# Patient Record
Sex: Female | Born: 1980 | Race: Black or African American | Hispanic: No | Marital: Single | Smoking: Former smoker
Health system: Southern US, Community
[De-identification: ages and names within clinical notes are randomized; demographics above are authoritative.]

## PROBLEM LIST (undated history)

## (undated) DIAGNOSIS — Z789 Other specified health status: Secondary | ICD-10-CM

## (undated) HISTORY — DX: Other specified health status: Z78.9

## (undated) HISTORY — PX: NO PAST SURGERIES: SHX2092

---

## 2013-06-15 ENCOUNTER — Emergency Department (HOSPITAL_COMMUNITY)
Admission: EM | Admit: 2013-06-15 | Discharge: 2013-06-15 | Disposition: A | Discharge status: Home / Self Care | Payer: Self-pay | Attending: Emergency Medicine | Admitting: Emergency Medicine

## 2013-06-15 DIAGNOSIS — J029 Acute pharyngitis, unspecified: Secondary | ICD-10-CM | POA: Insufficient documentation

## 2013-06-15 DIAGNOSIS — J3489 Other specified disorders of nose and nasal sinuses: Secondary | ICD-10-CM | POA: Insufficient documentation

## 2013-06-15 DIAGNOSIS — R071 Chest pain on breathing: Secondary | ICD-10-CM | POA: Insufficient documentation

## 2013-06-15 DIAGNOSIS — R1310 Dysphagia, unspecified: Secondary | ICD-10-CM | POA: Insufficient documentation

## 2013-06-15 DIAGNOSIS — J309 Allergic rhinitis, unspecified: Secondary | ICD-10-CM | POA: Insufficient documentation

## 2013-06-15 DIAGNOSIS — R112 Nausea with vomiting, unspecified: Secondary | ICD-10-CM | POA: Insufficient documentation

## 2013-06-15 LAB — RAPID STREP SCREEN (MED CTR MEBANE ONLY): Streptococcus, Group A Screen (Direct): NEGATIVE

## 2013-06-15 NOTE — ED Provider Notes (Signed)
CSN: [id number]     Arrival date & time 06/15/13  1206 History     First MD Initiated Contact with Patient 06/15/13 1221     Chief Complaint  Patient presents with  . Sore Throat   (Consider location/radiation/quality/duration/timing/severity/associated sxs/prior Treatment) HPI Comments: Patient is a 32 year old female presents today with sore throat since Saturday. She states it was associated with nausea and vomiting on Saturday, but The nausea and vomiting resolved later Saturday night. She has mild associated congestion and rhinorrhea. She reports a mild pleuritic chest pain. No long trips, recent surgeries. She is not on any supplemental estrogen. No past history of PE or DVT. She denies cough, fever, chills, ear pain. She is taking cough drops with no relief in her symptoms.  The history is provided by the patient. No language interpreter was used.    No past medical history on file. No past surgical history on file. No family history on file. History  Substance Use Topics  . Smoking status: Not on file  . Smokeless tobacco: Not on file  . Alcohol Use: Not on file   OB History   No data available     Review of Systems  Constitutional: Negative for fever and chills.  HENT: Positive for congestion, sore throat and rhinorrhea.   Gastrointestinal: Positive for nausea (resolved) and vomiting (resolved). Negative for abdominal pain.  All other systems reviewed and are negative.    Allergies  Review of patient's allergies indicates no known allergies.  Home Medications  No current outpatient prescriptions on file. BP 126/90  Pulse 77  Temp(Src) 98.3 F (36.8 C) (Oral)  Resp 18  SpO2 99% Physical Exam  Nursing note and vitals reviewed. Constitutional: She is oriented to person, place, and time. She appears well-developed and well-nourished. No distress.  HENT:  Head: Normocephalic and atraumatic. No trismus in the jaw.  Right Ear: Tympanic membrane, external ear and  ear canal normal.  Left Ear: Tympanic membrane, external ear and ear canal normal.  Nose: Nose normal.  Mouth/Throat: Uvula is midline, oropharynx is clear and moist and mucous membranes are normal. No oral lesions.  Eyes: Conjunctivae are normal.  Neck: Normal range of motion.  Cardiovascular: Normal rate, regular rhythm and normal heart sounds.   Pulmonary/Chest: Effort normal and breath sounds normal. No stridor. No respiratory distress. She has no wheezes. She has no rales.  Abdominal: Soft. She exhibits no distension.  Musculoskeletal: Normal range of motion.  Lymphadenopathy:    She has no cervical adenopathy.  Neurological: She is alert and oriented to person, place, and time. She has normal strength.  Skin: Skin is warm and dry. She is not diaphoretic. No erythema.  Psychiatric: She has a normal mood and affect. Her behavior is normal.    ED Course   Procedures (including critical care time)  Labs Reviewed  RAPID STREP SCREEN  CULTURE, GROUP A STREP   No results found. 1. Viral pharyngitis     MDM  Pt afebrile without tonsillar exudate, negative strep. Presents with no cervical lymphadenopathy, & mild dysphagia; diagnosis of viral pharyngitis. No abx indicated. DC w symptomatic tx for pain  Pt does not appear dehydrated, but did discuss importance of water rehydration. Presentation non concerning for PTA or infxn spread to soft tissue. No trismus or uvula deviation. Pleuritic chest pain. PERC negative. Specific return precautions discussed. Pt able to drink water in ED without difficulty with intact air way. Recommended PCP follow up.   Mora Bellman,  PA-C 06/15/13 1609

## 2013-06-15 NOTE — ED Notes (Signed)
Pt reports sore throat since Saturday morning.  Denies fever at home, denies sinus drainage. States her chest hurts and her body aches. No signs of distress at this time.  Denies cough.   Pt alert oriented X4

## 2013-06-17 LAB — CULTURE, GROUP A STREP

## 2013-06-18 NOTE — ED Provider Notes (Signed)
Medical screening examination/treatment/procedure(s) were performed by non-physician practitioner and as supervising physician I was immediately available for consultation/collaboration.  Candyce Churn, MD 06/18/13 [phone]

## 2015-11-18 ENCOUNTER — Emergency Department (HOSPITAL_COMMUNITY)
Admission: EM | Admit: 2015-11-18 | Discharge: 2015-11-18 | Disposition: A | Discharge status: Home / Self Care | Payer: Self-pay | Attending: Physician Assistant | Admitting: Physician Assistant

## 2015-11-18 ENCOUNTER — Encounter (HOSPITAL_COMMUNITY): Payer: Self-pay | Admitting: Emergency Medicine

## 2015-11-18 DIAGNOSIS — N6489 Other specified disorders of breast: Secondary | ICD-10-CM | POA: Insufficient documentation

## 2015-11-18 DIAGNOSIS — Z87891 Personal history of nicotine dependence: Secondary | ICD-10-CM | POA: Insufficient documentation

## 2015-11-18 DIAGNOSIS — N649 Disorder of breast, unspecified: Secondary | ICD-10-CM

## 2015-11-18 MED ORDER — CEPHALEXIN 500 MG PO CAPS: 500.0000 mg | ORAL_CAPSULE | Freq: Four times a day (QID) | ORAL | Status: DC

## 2015-11-18 NOTE — Discharge Instructions (Signed)

## 2015-11-18 NOTE — ED Notes (Signed)
"  bump" under right breast x 1 week. Appears red. No drainage.

## 2015-11-18 NOTE — ED Provider Notes (Signed)
CSN: [id number]     Arrival date & time 11/18/15  1322 History  By signing my name below, I, Essence Howell, attest that this documentation has been prepared under the direction and in the presence of Montine Circle, PA-C Electronically Signed: Ladene Artist, ED Scribe 11/18/2015 at 2:26 PM.   Chief Complaint  Patient presents with  . Breast Pain   The history is provided by the patient. No language interpreter was used.   HPI Comments: Krystal Gonzalez is a 35 y.o. female who presents to the Emergency Department with a chief complaint of a gradually worsening, tender abscess under the right breast first noticed 1 week ago. Pt states that the area started as a small bump 1 week ago but has grown in size. She reports constant, mild pain to the area that is exacerbated with palpation. No treatments tried PTA. She denies drainage, fever, chills, nausea, vomiting. No h/o previous abscess. Pt does not currently have a gynecologist.   History reviewed. No pertinent past medical history. History reviewed. No pertinent past surgical history. No family history on file. Social History  Substance Use Topics  . Smoking status: Former Research scientist (life sciences)  . Smokeless tobacco: None  . Alcohol Use: Yes   OB History    No data available     Review of Systems  Constitutional: Negative for fever and chills.  Gastrointestinal: Negative for nausea and vomiting.  Skin:       + Abscess   Allergies  Review of patient's allergies indicates no known allergies.  Home Medications   Prior to Admission medications   Not on File   BP 117/72 mmHg  Pulse 66  Temp(Src) 98.3 F (36.8 C) (Oral)  Resp 20  SpO2 100%  LMP 11/17/2015 Physical Exam  Constitutional: She is oriented to person, place, and time. She appears well-developed and well-nourished. No distress.  HENT:  Head: Normocephalic and atraumatic.  Eyes: Conjunctivae and EOM are normal.  Neck: Neck supple. No tracheal deviation present.  Cardiovascular:  Normal rate.   Pulmonary/Chest: Effort normal. No respiratory distress.  Musculoskeletal: Normal range of motion.  Neurological: She is alert and oriented to person, place, and time.  Skin: Skin is warm and dry.  0.5 cm erythematous lesion under R breast. Possible early abscess or cellulitis. No discharge or drainage.  Chaperone present  Psychiatric: She has a normal mood and affect. Her behavior is normal.  Nursing note and vitals reviewed.  ED Course  Procedures (including critical care time) DIAGNOSTIC STUDIES: Oxygen Saturation is 100% on RA, normal by my interpretation.    COORDINATION OF CARE: 2:17 PM-Discussed treatment plan which includes Keflex with pt at bedside and pt agreed to plan.     MDM   Final diagnoses:  Breast lesion    Patient with small cellulitic breast lesion under right breast. Lesion has been there for approximately 1 week. Will treat with Keflex. Recommend close follow-up with OB/GYN in the next 3 days for recheck. Patient may need further imaging, though currently I suspect this cellulitis without abscess.  I personally performed the services described in this documentation, which was scribed in my presence. The recorded information has been reviewed and is accurate.      Montine Circle, PA-C 11/18/15 [place], MD 11/19/15 [phone]

## 2017-06-10 ENCOUNTER — Encounter (HOSPITAL_COMMUNITY): Payer: Self-pay

## 2017-06-10 ENCOUNTER — Emergency Department (HOSPITAL_COMMUNITY)
Admission: EM | Admit: 2017-06-10 | Discharge: 2017-06-10 | Disposition: A | Discharge status: Home / Self Care | Payer: Self-pay | Attending: Emergency Medicine | Admitting: Emergency Medicine

## 2017-06-10 DIAGNOSIS — Z87891 Personal history of nicotine dependence: Secondary | ICD-10-CM | POA: Insufficient documentation

## 2017-06-10 DIAGNOSIS — Z79899 Other long term (current) drug therapy: Secondary | ICD-10-CM | POA: Insufficient documentation

## 2017-06-10 DIAGNOSIS — J020 Streptococcal pharyngitis: Secondary | ICD-10-CM | POA: Insufficient documentation

## 2017-06-10 LAB — POC URINE PREG, ED: PREG TEST UR: NEGATIVE

## 2017-06-10 LAB — RAPID STREP SCREEN (MED CTR MEBANE ONLY): STREPTOCOCCUS, GROUP A SCREEN (DIRECT): POSITIVE — AB

## 2017-06-10 MED ORDER — CLINDAMYCIN HCL 150 MG PO CAPS
300.0000 mg | ORAL_CAPSULE | Freq: Once | ORAL | Status: AC
Start: 2017-06-10 — End: 2017-06-10
  Administered 2017-06-10: 300 mg via ORAL
  Filled 2017-06-10: qty 2

## 2017-06-10 MED ORDER — LIDOCAINE VISCOUS 2 % MT SOLN: 20.0000 mL | OROMUCOSAL | 0 refills | Status: DC | PRN

## 2017-06-10 MED ORDER — DEXAMETHASONE 10 MG/ML FOR PEDIATRIC ORAL USE: 10.0000 mg | Freq: Once | INTRAMUSCULAR | Status: DC

## 2017-06-10 MED ORDER — CLINDAMYCIN HCL 150 MG PO CAPS: 150.0000 mg | ORAL_CAPSULE | Freq: Three times a day (TID) | ORAL | 0 refills | Status: DC

## 2017-06-10 MED ORDER — KETOROLAC TROMETHAMINE 30 MG/ML IJ SOLN
30.0000 mg | Freq: Once | INTRAMUSCULAR | Status: AC
Start: 2017-06-10 — End: 2017-06-10
  Administered 2017-06-10: 30 mg via INTRAMUSCULAR
  Filled 2017-06-10: qty 1

## 2017-06-10 MED ORDER — DEXAMETHASONE 4 MG PO TABS
10.0000 mg | ORAL_TABLET | Freq: Once | ORAL | Status: AC
  Administered 2017-06-10: 10 mg via ORAL
  Filled 2017-06-10: qty 3

## 2017-06-10 NOTE — ED Provider Notes (Signed)
[facility] DEPT Provider Note   CSN: [id number] Arrival date & time: 06/10/17  1247  By signing my name below, I, Ephriam Jenkins, attest that this documentation has been prepared under the direction and in the presence of [employer].  Electronically Signed: Ephriam Jenkins, ED Scribe. 06/10/17. 2:35 PM.  History   Chief Complaint Chief Complaint  Patient presents with  . Sore Throat   HPI HPI Comments: Krystal Gonzalez is a 36 y.o. female who presents to the Emergency Department complaining of sore throat with dysphagia that started yesterday. Pt further reports subjective fever, her temperature was 99.4 here today. She reports worst pain to the right side of her throat. Pt took Tylenol yesterday with no significant relief. She has not taken any medications today. No known sick contacts. No difficulty swallowing. No change in voice.   The history is provided by the patient. No language interpreter was used.    History reviewed. No pertinent past medical history.  There are no active problems to display for this patient.   History reviewed. No pertinent surgical history.  OB History    No data available       Home Medications    Prior to Admission medications   Medication Sig Start Date End Date Taking? Authorizing Provider  cephALEXin (KEFLEX) 500 MG capsule Take 1 capsule (500 mg total) by mouth 4 (four) times daily. 11/18/15   Montine Circle, PA-C    Family History History reviewed. No pertinent family history.  Social History Social History  Substance Use Topics  . Smoking status: Former Research scientist (life sciences)  . Smokeless tobacco: Never Used  . Alcohol use Yes     Allergies   Patient has no known allergies.   Review of Systems Review of Systems  Constitutional: Negative for chills and fever.  HENT: Positive for sore throat. Negative for dental problem, drooling, trouble swallowing and voice change.   Respiratory: Negative for shortness of breath.       Physical Exam Updated Vital Signs BP 118/75 (BP Location: Left Arm)   Pulse 100   Temp 99.4 F (37.4 C) (Oral)   Resp 18   Ht 5\' 3"  (1.6 m)   Wt 200 lb (90.7 kg)   LMP 05/27/2017 (Within Days)   SpO2 96%   BMI 35.43 kg/m   Physical Exam  Constitutional: She is oriented to person, place, and time. She appears well-developed and well-nourished. No distress.  HENT:  Head: Normocephalic and atraumatic.  Mouth/Throat: Uvula is midline and mucous membranes are normal. No trismus in the jaw. No uvula swelling. Oropharyngeal exudate, posterior oropharyngeal edema and posterior oropharyngeal erythema present. No tonsillar abscesses. Tonsils are 3+ on the right. Tonsils are 3+ on the left. Tonsillar exudate.  Speaking complete sentences without muffled voice.  Neck: Normal range of motion. Neck supple.  Pulmonary/Chest: Effort normal.  Managing secretions maintaining airway  Lymphadenopathy:    She has cervical adenopathy (bilaterall).  Neurological: She is alert and oriented to person, place, and time.  Skin: Skin is warm and dry. She is not diaphoretic.  Psychiatric: She has a normal mood and affect. Judgment normal.  Nursing note and vitals reviewed.    ED Treatments / Results  DIAGNOSTIC STUDIES: Oxygen Saturation is 96% on RA, normal by my interpretation.  COORDINATION OF CARE: 2:30 PM-Discussed treatment plan with pt at bedside and pt agreed to plan.   Labs (all labs ordered are listed, but only abnormal results are displayed) Labs Reviewed  RAPID STREP SCREEN (NOT  AT [facility]) - Abnormal; Notable for the following:       Result Value   Streptococcus, Group A Screen (Direct) POSITIVE (*)    All other components within normal limits  POC URINE PREG, ED    EKG  EKG Interpretation None       Radiology No results found.  Procedures Procedures (including critical care time)  Medications Ordered in ED Medications  ketorolac (TORADOL) 30 MG/ML injection 30 mg  (30 mg Intramuscular Given 06/10/17 1446)  clindamycin (CLEOCIN) capsule 300 mg (300 mg Oral Given 06/10/17 1446)  dexamethasone (DECADRON) tablet 10 mg (10 mg Oral Given 06/10/17 1446)     Initial Impression / Assessment and Plan / ED Course  I have reviewed the triage vital signs and the nursing notes.  Pertinent labs & imaging results that were available during my care of the patient were reviewed by me and considered in my medical decision making (see chart for details).     Pt afebrile with tonsillar exudate, cervical lymphadenopathy, & dysphagia; diagnosis of strep. Treated in the Ed with steroids, NSAIDs, Pain medication. Patient with penicillin allergy given clindamycin. Pt appears mildly dehydrated, discussed importance of water rehydration. Presentation non concerning for PTA or infxn spread to soft tissue. No trismus or uvula deviation. Specific return precautions discussed. Pt able to drink water in ED without difficulty with intact air way. Recommended PCP follow up.   Final Clinical Impressions(s) / ED Diagnoses   Final diagnoses:  Strep pharyngitis    New Prescriptions Discharge Medication List as of 06/10/2017  4:20 PM    START taking these medications   Details  clindamycin (CLEOCIN) 150 MG capsule Take 1 capsule (150 mg total) by mouth 3 (three) times daily., Starting Mon 06/10/2017, Print    lidocaine (XYLOCAINE) 2 % solution Use as directed 20 mLs in the mouth or throat as needed for mouth pain., Starting Mon 06/10/2017, Print      I personally performed the services described in this documentation, which was scribed in my presence. The recorded information has been reviewed and is accurate.     Doristine Devoid, PA-C 06/10/17 [place], DO 06/10/17 1739

## 2017-06-10 NOTE — Discharge Instructions (Signed)
Please of antibiotic as prescribed. Her strep test was positive. May take Motrin and Tylenol for pain and fever. Use the viscous lidocaine to rinsing her mouth and spit out. We have discussed reasons to return to the ED.

## 2017-06-10 NOTE — ED Triage Notes (Signed)
Pt states sore throat that began last night. She reports chills as well. States it is painful to swallow. Difficult to see but noted to have white spots on right tonsil. Pt afebrile in triage. Airway intact.

## 2019-09-27 ENCOUNTER — Emergency Department (HOSPITAL_COMMUNITY): Payer: No Typology Code available for payment source

## 2019-09-27 ENCOUNTER — Emergency Department (HOSPITAL_COMMUNITY)
Admission: EM | Admit: 2019-09-27 | Discharge: 2019-09-27 | Disposition: A | Discharge status: Home / Self Care | Payer: No Typology Code available for payment source | Attending: Emergency Medicine | Admitting: Emergency Medicine

## 2019-09-27 ENCOUNTER — Encounter (HOSPITAL_COMMUNITY): Payer: Self-pay | Admitting: Emergency Medicine

## 2019-09-27 ENCOUNTER — Other Ambulatory Visit: Payer: Self-pay

## 2019-09-27 DIAGNOSIS — M25512 Pain in left shoulder: Secondary | ICD-10-CM | POA: Diagnosis not present

## 2019-09-27 DIAGNOSIS — M7918 Myalgia, other site: Secondary | ICD-10-CM

## 2019-09-27 DIAGNOSIS — Z87891 Personal history of nicotine dependence: Secondary | ICD-10-CM | POA: Insufficient documentation

## 2019-09-27 DIAGNOSIS — M545 Low back pain: Secondary | ICD-10-CM | POA: Insufficient documentation

## 2019-09-27 MED ORDER — LIDOCAINE 5 % EX PTCH: 1.0000 | MEDICATED_PATCH | CUTANEOUS | 0 refills | Status: DC

## 2019-09-27 MED ORDER — NAPROXEN 375 MG PO TABS: 375.0000 mg | ORAL_TABLET | Freq: Two times a day (BID) | ORAL | 0 refills | Status: DC

## 2019-09-27 MED ORDER — METHOCARBAMOL 500 MG PO TABS: 500.0000 mg | ORAL_TABLET | Freq: Two times a day (BID) | ORAL | 0 refills | Status: DC

## 2019-09-27 MED ORDER — ACETAMINOPHEN 325 MG PO TABS
650.0000 mg | ORAL_TABLET | Freq: Once | ORAL | Status: AC
  Administered 2019-09-27: 650 mg via ORAL
  Filled 2019-09-27: qty 2
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
## 2019-09-27 NOTE — ED Notes (Signed)
Pt signed preg waiver in xray, discontinuing order for POC preg test
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
## 2019-09-27 NOTE — ED Notes (Signed)
Patient verbalizes understanding of discharge instructions. Opportunity for questioning and answers were provided. Armband removed by staff, pt discharged from ED.  

## 2021-08-19 ENCOUNTER — Encounter (HOSPITAL_BASED_OUTPATIENT_CLINIC_OR_DEPARTMENT_OTHER): Payer: Self-pay | Admitting: Emergency Medicine

## 2021-08-19 ENCOUNTER — Other Ambulatory Visit: Payer: Self-pay

## 2021-08-19 ENCOUNTER — Emergency Department (HOSPITAL_BASED_OUTPATIENT_CLINIC_OR_DEPARTMENT_OTHER)
Admission: EM | Admit: 2021-08-19 | Discharge: 2021-08-19 | Disposition: A | Discharge status: Home / Self Care | Payer: Medicaid Other | Attending: Emergency Medicine | Admitting: Emergency Medicine

## 2021-08-19 DIAGNOSIS — Z87891 Personal history of nicotine dependence: Secondary | ICD-10-CM | POA: Diagnosis not present

## 2021-08-19 DIAGNOSIS — Z3201 Encounter for pregnancy test, result positive: Secondary | ICD-10-CM | POA: Diagnosis not present

## 2021-08-19 DIAGNOSIS — O219 Vomiting of pregnancy, unspecified: Secondary | ICD-10-CM | POA: Insufficient documentation

## 2021-08-19 DIAGNOSIS — Z3A00 Weeks of gestation of pregnancy not specified: Secondary | ICD-10-CM | POA: Diagnosis not present

## 2021-08-19 LAB — PREGNANCY, URINE: Preg Test, Ur: POSITIVE — AB

## 2021-08-19 NOTE — ED Triage Notes (Signed)
Nausea , 2 weeks late , requesting preg test. No further symptoms

## 2021-08-19 NOTE — ED Notes (Signed)
Pt discharged to home. Discharge instructions have been discussed with patient and/or family members. Pt verbally acknowledges understanding d/c instructions, and endorses comprehension to checkout at registration before leaving.  °

## 2021-08-19 NOTE — ED Provider Notes (Signed)
[facility] EMERGENCY DEPT Provider Note   CSN: [id number] Arrival date & time: 08/19/21  1235     History Chief Complaint  Patient presents with   Nausea    Krystal Gonzalez is a 40 y.o. female presents to the emergency department with nausea and requesting pregnancy test.  Patient states that her menstrual period is approximately 2 weeks late.  She did take a drugstore pregnancy test that was positive.  She presents today with persistent nausea and wanting to verify her pregnancy status.  She is having no other symptoms at this time.  No fevers, chills, chest pain, shortness of breath, abdominal pain, pelvic pain, vaginal bleeding or discharge, dysuria or hematuria.  HPI     No past medical history on file.  There are no problems to display for this patient.   History reviewed. No pertinent surgical history.   OB History   No obstetric history on file.     No family history on file.  Social History   Tobacco Use   Smoking status: Former   Smokeless tobacco: Never  Substance Use Topics   Alcohol use: Yes   Drug use: No    Home Medications Prior to Admission medications   Medication Sig Start Date End Date Taking? Authorizing Provider  cephALEXin (KEFLEX) 500 MG capsule Take 1 capsule (500 mg total) by mouth 4 (four) times daily. 11/18/15   Montine Circle, PA-C  clindamycin (CLEOCIN) 150 MG capsule Take 1 capsule (150 mg total) by mouth 3 (three) times daily. 06/10/17   Ocie Cornfield T, PA-C  lidocaine (LIDODERM) 5 % Place 1 patch onto the skin daily. Remove & Discard patch within 12 hours or as directed by MD 09/27/19   Nuala Alpha A, PA-C  lidocaine (XYLOCAINE) 2 % solution Use as directed 20 mLs in the mouth or throat as needed for mouth pain. 06/10/17   Doristine Devoid, PA-C  methocarbamol (ROBAXIN) 500 MG tablet Take 1 tablet (500 mg total) by mouth 2 (two) times daily. 09/27/19   Nuala Alpha A, PA-C  naproxen (NAPROSYN) 375 MG  tablet Take 1 tablet (375 mg total) by mouth 2 (two) times daily. 09/27/19   Deliah Boston, PA-C    Allergies    Patient has no known allergies.  Review of Systems   Review of Systems  Constitutional:  Negative for chills and fever.  Respiratory:  Negative for shortness of breath.   Cardiovascular:  Negative for chest pain.  Gastrointestinal:  Positive for nausea and vomiting. Negative for abdominal pain, constipation and diarrhea.  Genitourinary:  Positive for menstrual problem. Negative for dysuria, frequency, hematuria, pelvic pain, urgency, vaginal bleeding and vaginal discharge.  All other systems reviewed and are negative.  Physical Exam Updated Vital Signs Ht 5\' 4"  (1.626 m)   Wt 90.7 kg   LMP 07/05/2021 (Approximate)   BMI 34.33 kg/m   Physical Exam Vitals and nursing note reviewed.  Constitutional:      Appearance: Normal appearance.  HENT:     Head: Normocephalic and atraumatic.  Eyes:     Conjunctiva/sclera: Conjunctivae normal.  Pulmonary:     Effort: Pulmonary effort is normal. No respiratory distress.  Abdominal:     General: There is no distension.     Palpations: Abdomen is soft.     Tenderness: There is no abdominal tenderness. There is no guarding.  Skin:    General: Skin is warm and dry.  Neurological:     Mental Status: She is alert.  Psychiatric:        Mood and Affect: Mood normal.        Behavior: Behavior normal.    ED Results / Procedures / Treatments   Labs (all labs ordered are listed, but only abnormal results are displayed) Labs Reviewed  PREGNANCY, URINE - Abnormal; Notable for the following components:      Result Value   Preg Test, Ur POSITIVE (*)    All other components within normal limits    EKG None  Radiology No results found.  Procedures Procedures   Medications Ordered in ED Medications - No data to display  ED Course  I have reviewed the triage vital signs and the nursing notes.  Pertinent labs &  imaging results that were available during my care of the patient were reviewed by me and considered in my medical decision making (see chart for details).    MDM Rules/Calculators/A&P                           Patient is 40 year old female who presents with nausea and requesting pregnancy test.  She states that her menstrual period is approximately 2 weeks late, and she took a drugstore pregnancy test that was negative.  She presents today wanting to confirm this result.  Other than her nausea she has had no other symptoms.  No fevers, chills, abdominal pain, pelvic pain, vaginal bleeding or discharge, dysuria, hematuria, increased urinary frequency or urgency.  Urine pregnancy test positive.  Discussed results with patient.  She does not have an established OB/GYN so I will give her the contact information for this.  She is otherwise not requiring further evaluation, admission or inpatient treatment for symptoms at this time.  Plan to discharge home with strict return precautions.  Patient agreeable to plan.  Final Clinical Impression(s) / ED Diagnoses Final diagnoses:  Nausea and vomiting during pregnancy  Positive pregnancy test    Rx / DC Orders ED Discharge Orders     None        Kateri Plummer, PA-C 08/19/21 1446    Gareth Morgan, MD 08/20/21 2313

## 2021-08-19 NOTE — Discharge Instructions (Addendum)
You were seen in the emergency department today for nausea and requesting a pregnancy test.  Your urine pregnancy test was positive today.  As we discussed you need to follow-up with an OB/GYN for blood work and ultrasound for dating.  I have attached their contact information for you to make an appointment.  I recommend you start taking prenatal vitamins. Refrain from taking aspirin or ibuprofen, drinking alcohol or smoking cigarettes.   Continue to monitor how you're doing and return to the ER for new or worsening symptoms such as abdominal pain, vaginal bleeding or pelvic pain.   It has been a pleasure seeing and caring for you today and I wish you luck with your pregnancy!

## 2021-10-12 ENCOUNTER — Other Ambulatory Visit: Payer: Self-pay

## 2021-10-12 ENCOUNTER — Other Ambulatory Visit: Payer: Self-pay | Admitting: Obstetrics & Gynecology

## 2021-10-12 ENCOUNTER — Other Ambulatory Visit: Payer: Self-pay | Admitting: Family

## 2021-10-12 DIAGNOSIS — N6310 Unspecified lump in the right breast, unspecified quadrant: Secondary | ICD-10-CM

## 2021-10-12 LAB — OB RESULTS CONSOLE RUBELLA ANTIBODY, IGM: Rubella: IMMUNE

## 2021-10-12 LAB — OB RESULTS CONSOLE ANTIBODY SCREEN: Antibody Screen: NEGATIVE

## 2021-10-12 LAB — HEPATITIS C ANTIBODY: HCV Ab: NEGATIVE

## 2021-10-12 LAB — OB RESULTS CONSOLE ABO/RH: RH Type: POSITIVE

## 2021-10-12 LAB — OB RESULTS CONSOLE HEPATITIS B SURFACE ANTIGEN: Hepatitis B Surface Ag: NEGATIVE

## 2021-10-23 ENCOUNTER — Other Ambulatory Visit: Payer: Self-pay

## 2021-10-28 ENCOUNTER — Encounter (HOSPITAL_BASED_OUTPATIENT_CLINIC_OR_DEPARTMENT_OTHER): Payer: Self-pay | Admitting: Emergency Medicine

## 2021-10-28 ENCOUNTER — Other Ambulatory Visit: Payer: Self-pay

## 2021-10-28 ENCOUNTER — Emergency Department (HOSPITAL_BASED_OUTPATIENT_CLINIC_OR_DEPARTMENT_OTHER)
Admission: EM | Admit: 2021-10-28 | Discharge: 2021-10-28 | Disposition: A | Discharge status: Home / Self Care | Payer: Medicaid Other | Attending: Emergency Medicine | Admitting: Emergency Medicine

## 2021-10-28 ENCOUNTER — Emergency Department (HOSPITAL_BASED_OUTPATIENT_CLINIC_OR_DEPARTMENT_OTHER): Payer: Medicaid Other

## 2021-10-28 DIAGNOSIS — Z87891 Personal history of nicotine dependence: Secondary | ICD-10-CM | POA: Diagnosis not present

## 2021-10-28 DIAGNOSIS — O26892 Other specified pregnancy related conditions, second trimester: Secondary | ICD-10-CM | POA: Insufficient documentation

## 2021-10-28 DIAGNOSIS — Z79899 Other long term (current) drug therapy: Secondary | ICD-10-CM | POA: Insufficient documentation

## 2021-10-28 DIAGNOSIS — Z3A17 17 weeks gestation of pregnancy: Secondary | ICD-10-CM | POA: Insufficient documentation

## 2021-10-28 LAB — CBC WITH DIFFERENTIAL/PLATELET
Abs Immature Granulocytes: 0.04 10*3/uL (ref 0.00–0.07)
Basophils Absolute: 0 10*3/uL (ref 0.0–0.1)
Basophils Relative: 0 %
Eosinophils Absolute: 0.6 10*3/uL — ABNORMAL HIGH (ref 0.0–0.5)
Eosinophils Relative: 5 %
HCT: 35.3 % — ABNORMAL LOW (ref 36.0–46.0)
Hemoglobin: 11.8 g/dL — ABNORMAL LOW (ref 12.0–15.0)
Immature Granulocytes: 0 %
Lymphocytes Relative: 29 %
Lymphs Abs: 3.3 10*3/uL (ref 0.7–4.0)
MCH: 30.9 pg (ref 26.0–34.0)
MCHC: 33.4 g/dL (ref 30.0–36.0)
MCV: 92.4 fL (ref 80.0–100.0)
Monocytes Absolute: 0.9 10*3/uL (ref 0.1–1.0)
Monocytes Relative: 8 %
Neutro Abs: 6.6 10*3/uL (ref 1.7–7.7)
Neutrophils Relative %: 58 %
Platelets: 281 10*3/uL (ref 150–400)
RBC: 3.82 MIL/uL — ABNORMAL LOW (ref 3.87–5.11)
RDW: 13 % (ref 11.5–15.5)
WBC: 11.5 10*3/uL — ABNORMAL HIGH (ref 4.0–10.5)
nRBC: 0 % (ref 0.0–0.2)

## 2021-10-28 LAB — COMPREHENSIVE METABOLIC PANEL
ALT: 24 U/L (ref 0–44)
AST: 19 U/L (ref 15–41)
Albumin: 3.7 g/dL (ref 3.5–5.0)
Alkaline Phosphatase: 36 U/L — ABNORMAL LOW (ref 38–126)
Anion gap: 8 (ref 5–15)
BUN: 12 mg/dL (ref 6–20)
CO2: 25 mmol/L (ref 22–32)
Calcium: 9.5 mg/dL (ref 8.9–10.3)
Chloride: 101 mmol/L (ref 98–111)
Creatinine, Ser: 0.54 mg/dL (ref 0.44–1.00)
GFR, Estimated: >60 mL/min (ref >60)
Glucose, Bld: 76 mg/dL (ref 70–99)
Potassium: 3.6 mmol/L (ref 3.5–5.1)
Sodium: 134 mmol/L — ABNORMAL LOW (ref 135–145)
Total Bilirubin: 0.3 mg/dL (ref 0.3–1.2)
Total Protein: 6.8 g/dL (ref 6.5–8.1)

## 2021-10-28 LAB — HCG, QUANTITATIVE, PREGNANCY: hCG, Beta Chain, Quant, S: 28436 m[IU]/mL — ABNORMAL HIGH (ref <5)

## 2021-10-28 NOTE — ED Triage Notes (Signed)
16 wks pregnancy , abdominal cramps , lower back pain, denies vaginal bleeding. Sts feels baby moving 2 days ago .

## 2021-10-28 NOTE — ED Notes (Signed)
Denies any pain or vaginal discharges.

## 2021-10-28 NOTE — ED Provider Notes (Signed)
[name] EMERGENCY DEPT Provider Note   CSN: [id number] Arrival date & time: 10/28/21  1140     History Chief Complaint  Patient presents with   Abdominal Pain    Krystal Gonzalez is a 40 y.o. female who reports being [redacted] weeks pregnant presenting today due to abdominal cramping.  These cramps happened earlier today however are no longer bothering her.  No bleeding or vaginal discharge.  Is following with the health department for her pregnancy.  Has not yet had an ultrasound and would like one today.  Denies any nausea, vomiting, diarrhea, abdominal pain or abdominal trauma.  No history of abdominal surgery.  No fevers or chills.     No past medical history on file.  There are no problems to display for this patient.   No past surgical history on file.   OB History     Gravida  1   Para      Term      Preterm      AB      Living         SAB      IAB      Ectopic      Multiple      Live Births              No family history on file.  Social History   Tobacco Use   Smoking status: Former   Smokeless tobacco: Never  Substance Use Topics   Alcohol use: Yes   Drug use: No    Home Medications Prior to Admission medications   Medication Sig Start Date End Date Taking? Authorizing Provider  cephALEXin (KEFLEX) 500 MG capsule Take 1 capsule (500 mg total) by mouth 4 (four) times daily. 11/18/15   Montine Circle, PA-C  clindamycin (CLEOCIN) 150 MG capsule Take 1 capsule (150 mg total) by mouth 3 (three) times daily. 06/10/17   Ocie Cornfield T, PA-C  lidocaine (LIDODERM) 5 % Place 1 patch onto the skin daily. Remove & Discard patch within 12 hours or as directed by MD 09/27/19   Nuala Alpha A, PA-C  lidocaine (XYLOCAINE) 2 % solution Use as directed 20 mLs in the mouth or throat as needed for mouth pain. 06/10/17   Doristine Devoid, PA-C  methocarbamol (ROBAXIN) 500 MG tablet Take 1 tablet (500 mg total) by mouth 2 (two) times  daily. 09/27/19   Nuala Alpha A, PA-C  naproxen (NAPROSYN) 375 MG tablet Take 1 tablet (375 mg total) by mouth 2 (two) times daily. 09/27/19   Deliah Boston, PA-C    Allergies    Patient has no known allergies.  Review of Systems   Review of Systems  Gastrointestinal:  Negative for abdominal pain, diarrhea, nausea and vomiting.  Genitourinary:  Negative for vaginal bleeding and vaginal discharge.   Physical Exam Updated Vital Signs BP 129/77    Pulse 78    Temp 98.5 F (36.9 C)    Resp 15    Ht 5\' 3"  (1.6 m)    Wt 90.7 kg    LMP 07/05/2021 (Approximate)    SpO2 100%    BMI 35.43 kg/m   Physical Exam Vitals and nursing note reviewed.  Constitutional:      General: She is not in acute distress.    Appearance: Normal appearance. She is not ill-appearing.  HENT:     Head: Normocephalic and atraumatic.  Eyes:     General: No scleral icterus.  Conjunctiva/sclera: Conjunctivae normal.  Pulmonary:     Effort: Pulmonary effort is normal. No respiratory distress.  Abdominal:     General: Abdomen is flat.     Palpations: Abdomen is soft.     Tenderness: There is no abdominal tenderness.     Hernia: No hernia is present.  Skin:    General: Skin is warm and dry.     Findings: No rash.  Neurological:     Mental Status: She is alert.  Psychiatric:        Mood and Affect: Mood normal.    ED Results / Procedures / Treatments   Labs (all labs ordered are listed, but only abnormal results are displayed) Labs Reviewed  HCG, QUANTITATIVE, PREGNANCY  URINALYSIS, ROUTINE W REFLEX MICROSCOPIC  CBC WITH DIFFERENTIAL/PLATELET  COMPREHENSIVE METABOLIC PANEL    EKG None  Radiology US OB Limited > 14 wks  Result Date: 10/28/2021 CLINICAL DATA:  Abdominal/pelvic cramping. No prior OB ultrasound. Patient is 16 weeks and 5 days pregnant based on her last menstrual period. EXAM: LIMITED OBSTETRIC ULTRASOUND COMPARISON:  None. FINDINGS: Number of Fetuses: 1 Heart Rate:  155 bpm  Movement: Yes Presentation: Cephalic Placental Location: Posterior Previa: No Amniotic Fluid (Subjective):  Within normal limits FL: 2.51 cm 17 w  4 d MATERNAL FINDINGS: Cervix:  Appears closed. Uterus/Adnexae: No abnormality visualized. IMPRESSION: 1. Single live intrauterine pregnancy with a measured gestational age, based on a femur length, a 17 weeks and 4 days. 2. No evidence of a pregnancy complication. This exam is performed on an emergent basis and does not comprehensively evaluate fetal size, dating, or anatomy; follow-up complete OB US should be considered if further fetal assessment is warranted. Electronically Signed   By: Lajean Manes M.D.   On: 10/28/2021 14:38    Procedures Procedures   Medications Ordered in ED Medications - No data to display  ED Course  I have reviewed the triage vital signs and the nursing notes.  Pertinent labs & imaging results that were available during my care of the patient were reviewed by me and considered in my medical decision making (see chart for details).    MDM Rules/Calculators/A&P 40 year old female presenting today with complaint of abdominal cramping.  Reports 16 weeks of pregnancy that is being followed by the health department.  Denies bleeding, vaginal discharge or prom.  She has not had an ultrasound and was requesting 1 today.  It showed a single IUP and they estimated the age of 35 weeks and 4 days.  Patient was made aware of this and is requesting a copy of her work-up for the health department.  I told her that she would need to get in contact with medical records however I am able to give her a read of the ultrasound.  She is agreeable to this.  No signs of pregnancy complications at this moment.  Abdominal pain has not been present since she has been in the department.  Discharge at this time. Final Clinical Impression(s) / ED Diagnoses Final diagnoses:  [redacted] weeks gestation of pregnancy    Rx / DC Orders Results and diagnoses  were explained to the patient. Return precautions discussed in full. Patient had no additional questions and expressed complete understanding.     Rhae Hammock, PA-C 10/28/21 1534    Lucrezia Starch, MD 10/30/21 [phone]

## 2021-10-28 NOTE — Discharge Instructions (Addendum)
Your work-up is reassuring today.  Your ultrasound shows a pregnancy that is in the correct place.  The read is as follows: Single live intrauterine pregnancy with a measured gestational  age, based on a femur length, a 17 weeks and 4 days.    Please continue to follow with the health department for your pregnancy and read the attached information.

## 2021-11-12 NOTE — L&D Delivery Note (Signed)
Delivery Note ?At 1:04 PM a viable and healthy female was delivered via Vaginal, Spontaneous (Presentation: Right Occiput Anterior).  APGAR: 9, 9; weight  pending.   ?Placenta status: Spontaneous, Intact.  Cord: 3 vessels with the following complications: None.   ? ?Anesthesia: Local ?Episiotomy: None ?Lacerations: 1st degree;Perineal ?Suture Repair: 3.0 vicryl ?Est. Blood Loss (mL): 100 ? ?Mom to postpartum.  Baby to Couplet care / Skin to Skin. ? ?Gayla Dockham is a 41 y.o. female [id number] with IUP at 24w0dadmitted for IOL for AMA.  She progressed with Pitocin for augmentation to complete and pushed with 2 contractions to deliver.  Cord clamping delayed by 1-3 minutes then clamped by CNM and cut by family member.  Placenta intact and spontaneous, bleeding minimal.  First degree perineal laceration. Held pressure x 3-5 minutes but site continue to bleed lightly so repaired without difficulty with 3.0 vicryl.  Mom and baby stable prior to transfer to postpartum. She plans on breastfeeding. ? ? ?LLattie HawLeftwich-Kirby ?03/28/2022, 1:32 PM ? ? ? ?

## 2021-11-15 ENCOUNTER — Other Ambulatory Visit: Payer: Self-pay

## 2021-11-15 ENCOUNTER — Ambulatory Visit
Admission: RE | Admit: 2021-11-15 | Discharge: 2021-11-15 | Disposition: A | Discharge status: Home / Self Care | Payer: Medicaid Other | Source: Ambulatory Visit | Attending: Family | Admitting: Family

## 2021-11-15 DIAGNOSIS — N6310 Unspecified lump in the right breast, unspecified quadrant: Secondary | ICD-10-CM

## 2021-11-29 ENCOUNTER — Encounter: Payer: Self-pay | Admitting: *Deleted

## 2021-12-04 ENCOUNTER — Other Ambulatory Visit: Payer: Self-pay | Admitting: Nurse Practitioner

## 2021-12-04 DIAGNOSIS — Z3A22 22 weeks gestation of pregnancy: Secondary | ICD-10-CM

## 2021-12-04 DIAGNOSIS — Z363 Encounter for antenatal screening for malformations: Secondary | ICD-10-CM

## 2021-12-04 DIAGNOSIS — O09522 Supervision of elderly multigravida, second trimester: Secondary | ICD-10-CM

## 2021-12-05 ENCOUNTER — Other Ambulatory Visit: Payer: Self-pay

## 2021-12-05 ENCOUNTER — Ambulatory Visit: Payer: Medicaid Other | Attending: Nurse Practitioner

## 2021-12-05 ENCOUNTER — Ambulatory Visit: Payer: Medicaid Other | Admitting: *Deleted

## 2021-12-05 ENCOUNTER — Encounter: Payer: Self-pay | Admitting: *Deleted

## 2021-12-05 VITALS — BP 116/69 | HR 83

## 2021-12-05 DIAGNOSIS — O09522 Supervision of elderly multigravida, second trimester: Secondary | ICD-10-CM | POA: Diagnosis present

## 2021-12-05 DIAGNOSIS — Z3A22 22 weeks gestation of pregnancy: Secondary | ICD-10-CM | POA: Diagnosis present

## 2021-12-05 DIAGNOSIS — Z363 Encounter for antenatal screening for malformations: Secondary | ICD-10-CM | POA: Insufficient documentation

## 2021-12-06 ENCOUNTER — Other Ambulatory Visit: Payer: Self-pay | Admitting: *Deleted

## 2021-12-06 DIAGNOSIS — O09522 Supervision of elderly multigravida, second trimester: Secondary | ICD-10-CM

## 2021-12-06 DIAGNOSIS — Z362 Encounter for other antenatal screening follow-up: Secondary | ICD-10-CM

## 2022-01-03 ENCOUNTER — Other Ambulatory Visit: Payer: Self-pay

## 2022-01-03 ENCOUNTER — Encounter: Payer: Self-pay | Admitting: *Deleted

## 2022-01-03 ENCOUNTER — Ambulatory Visit: Payer: Medicaid Other | Admitting: *Deleted

## 2022-01-03 ENCOUNTER — Ambulatory Visit: Payer: Medicaid Other | Attending: Obstetrics

## 2022-01-03 ENCOUNTER — Other Ambulatory Visit: Payer: Self-pay | Admitting: *Deleted

## 2022-01-03 VITALS — BP 112/74 | HR 83

## 2022-01-03 DIAGNOSIS — O09522 Supervision of elderly multigravida, second trimester: Secondary | ICD-10-CM | POA: Diagnosis not present

## 2022-01-03 DIAGNOSIS — O3410 Maternal care for benign tumor of corpus uteri, unspecified trimester: Secondary | ICD-10-CM

## 2022-01-03 DIAGNOSIS — Z3A27 27 weeks gestation of pregnancy: Secondary | ICD-10-CM | POA: Diagnosis not present

## 2022-01-03 DIAGNOSIS — Z362 Encounter for other antenatal screening follow-up: Secondary | ICD-10-CM | POA: Diagnosis not present

## 2022-01-03 DIAGNOSIS — O3412 Maternal care for benign tumor of corpus uteri, second trimester: Secondary | ICD-10-CM | POA: Diagnosis not present

## 2022-01-03 DIAGNOSIS — O09523 Supervision of elderly multigravida, third trimester: Secondary | ICD-10-CM

## 2022-01-03 DIAGNOSIS — D259 Leiomyoma of uterus, unspecified: Secondary | ICD-10-CM

## 2022-01-16 LAB — OB RESULTS CONSOLE RPR: RPR: NONREACTIVE

## 2022-01-16 LAB — OB RESULTS CONSOLE HIV ANTIBODY (ROUTINE TESTING): HIV: NONREACTIVE

## 2022-01-28 ENCOUNTER — Inpatient Hospital Stay (HOSPITAL_COMMUNITY)
Admission: AD | Admit: 2022-01-28 | Discharge: 2022-01-28 | Disposition: A | Discharge status: Home / Self Care | Payer: Medicaid Other | Attending: Family Medicine | Admitting: Family Medicine

## 2022-01-28 ENCOUNTER — Other Ambulatory Visit: Payer: Self-pay

## 2022-01-28 ENCOUNTER — Encounter (HOSPITAL_COMMUNITY): Payer: Self-pay | Admitting: Family Medicine

## 2022-01-28 DIAGNOSIS — O26893 Other specified pregnancy related conditions, third trimester: Secondary | ICD-10-CM | POA: Insufficient documentation

## 2022-01-28 DIAGNOSIS — R059 Cough, unspecified: Secondary | ICD-10-CM | POA: Insufficient documentation

## 2022-01-28 DIAGNOSIS — O99613 Diseases of the digestive system complicating pregnancy, third trimester: Secondary | ICD-10-CM | POA: Diagnosis not present

## 2022-01-28 DIAGNOSIS — J00 Acute nasopharyngitis [common cold]: Secondary | ICD-10-CM

## 2022-01-28 DIAGNOSIS — O99513 Diseases of the respiratory system complicating pregnancy, third trimester: Secondary | ICD-10-CM | POA: Diagnosis not present

## 2022-01-28 DIAGNOSIS — K439 Ventral hernia without obstruction or gangrene: Secondary | ICD-10-CM | POA: Diagnosis not present

## 2022-01-28 DIAGNOSIS — Z3A30 30 weeks gestation of pregnancy: Secondary | ICD-10-CM | POA: Diagnosis not present

## 2022-01-28 DIAGNOSIS — Z3689 Encounter for other specified antenatal screening: Secondary | ICD-10-CM | POA: Diagnosis not present

## 2022-01-28 NOTE — MAU Provider Note (Signed)
?History  ?  ? ?CSN: [id number] ? ?Arrival date and time: 01/28/22 1001 ? ?Chief Complaint  ?Patient presents with  ? Cough  ? Nasal Congestion  ? ?41 year old G5 P4-0-0-4 at 30.4 weeks presenting with cold symptoms and hernia concern.  Patient reports onset of symptoms 3 days ago including nasal congestion and productive cough.  Denies sore throat fever, body aches.  Reports her 49 year old son had similar symptoms recently.  Denies flu or COVID exposure.  Endorses worsening hernia protrusion since she has been coughing.  She is able to push it back in.  Reports hernia has been ongoing throughout her previous pregnancies but worse with this one.  Denies pain at the hernia site.  Good fetal movement.  No pregnancy complaints. ? ? ?OB History   ? ? Gravida  ?5  ? Para  ?4  ? Term  ?4  ? Preterm  ?   ? AB  ?   ? Living  ?4  ?  ? ? SAB  ?   ? IAB  ?   ? Ectopic  ?   ? Multiple  ?   ? Live Births  ?4  ?   ?  ?  ? ? ?Past Medical History:  ?Diagnosis Date  ? Medical history non-contributory   ? ? ?Past Surgical History:  ?Procedure Laterality Date  ? NO PAST SURGERIES    ? ? ?Family History  ?Problem Relation Age of Onset  ? Hypertension Mother   ? ? ?Social History  ? ?Tobacco Use  ? Smoking status: Former  ?  Types: Cigars  ? Smokeless tobacco: Never  ?Vaping Use  ? Vaping Use: Never used  ?Substance Use Topics  ? Alcohol use: Not Currently  ? Drug use: No  ? ? ?Allergies: No Known Allergies ? ?Medications Prior to Admission  ?Medication Sig Dispense Refill Last Dose  ? omeprazole (PRILOSEC) 20 MG capsule Take 20 mg by mouth daily.     ? Prenatal Vit-Fe Fumarate-FA (MULTIVITAMIN-PRENATAL) 27-0.8 MG TABS tablet Take 1 tablet by mouth daily at 12 noon.     ? ? ?Review of Systems  ?Constitutional:  Negative for chills and fever.  ?HENT:  Positive for congestion. Negative for ear pain and sore throat.   ?Respiratory:  Positive for cough. Negative for shortness of breath.   ?Cardiovascular:  Negative for chest pain.   ?Gastrointestinal:  Negative for abdominal pain.  ?Genitourinary:  Negative for vaginal bleeding and vaginal discharge.  ?Physical Exam  ? ?Blood pressure 115/70, pulse (!) 102, temperature 98.5 ?F (36.9 ?C), temperature source Oral, resp. rate 16, height '5\' 4"'$  (1.626 m), weight 95.8 kg, last menstrual period 07/04/2021, SpO2 97 %. ? ?Physical Exam ?Vitals and nursing note reviewed.  ?Constitutional:   ?   General: She is not in acute distress. ?   Appearance: Normal appearance.  ?HENT:  ?   Head: Normocephalic and atraumatic.  ?Cardiovascular:  ?   Rate and Rhythm: Normal rate and regular rhythm.  ?   Heart sounds: Normal heart sounds.  ?Pulmonary:  ?   Effort: Pulmonary effort is normal. No respiratory distress.  ?   Breath sounds: Normal breath sounds. No stridor. No wheezing, rhonchi or rales.  ?Abdominal:  ?   Palpations: Abdomen is soft.  ?   Tenderness: There is no abdominal tenderness.  ?   Hernia: A hernia (ventral, 4cm, present with abdominal contraction, easily reducible) is present.  ?Musculoskeletal:     ?   General:  Normal range of motion.  ?   Cervical back: Normal range of motion.  ?Skin: ?   General: Skin is warm and dry.  ?Neurological:  ?   General: No focal deficit present.  ?   Mental Status: She is alert and oriented to person, place, and time.  ?Psychiatric:     ?   Mood and Affect: Mood normal.     ?   Behavior: Behavior normal.  ?EFM: 140 bpm, mod variability, + accels, no decels ?Toco: none ? ?No results found for this or any previous visit (from the past 24 hour(s)). ? ?MAU Course  ?Procedures ? ?MDM ?Symptoms consistent with viral process, no signs of flu or COVID, discussed symptom management.  Recommend ambulatory referral to general surgery in anticipation of needing surgical management postdelivery.  Stable for discharge. ? ?Assessment and Plan  ? ?1. [redacted] weeks gestation of pregnancy   ?2. Common cold   ?3. Ventral hernia without obstruction or gangrene   ?4. NST (non-stress test)  reactive   ? ?Discharge home ?Follow-up with [facility] in 2 days as scheduled ?Referral to general surgery ?Safe medication list provided ? ?Allergies as of 01/28/2022   ?No Known Allergies ?  ? ?  ?Medication List  ?  ? ?TAKE these medications   ? ?multivitamin-prenatal 27-0.8 MG Tabs tablet ?Take 1 tablet by mouth daily at 12 noon. ?  ?omeprazole 20 MG capsule ?Commonly known as: PRILOSEC ?Take 20 mg by mouth daily. ?  ? ?  ? ? ?Julianne Handler, CNM ?01/28/2022, 11:10 AM  ?

## 2022-01-28 NOTE — Discharge Instructions (Signed)

## 2022-01-28 NOTE — MAU Note (Signed)
Krystal Gonzalez is a 41 y.o. at 73w4dhere in MAU reporting: has had a cough and congestion for the past 3 days. Is unsure about what to take. States she has a hernia and wants to get it checked it. States it comes out when she is coughing or vomiting. No bleeding or LOF. +FM. Denies any chest pain or SOB. States she did have 1 episode of emesis last night but thinks it was related to the coughing.  ? ?Onset of complaint: ongoing ? ?Pain score: 0/10 ? ?Vitals:  ? 01/28/22 1017  ?BP: 115/70  ?Pulse: (!) 102  ?Resp: 16  ?Temp: 98.5 ?F (36.9 ?C)  ?SpO2: 97%  ?   ?FHT:149 ? ?Lab orders placed from triage: UA ? ?

## 2022-02-02 ENCOUNTER — Encounter: Payer: Self-pay | Admitting: *Deleted

## 2022-02-02 ENCOUNTER — Ambulatory Visit: Payer: Medicaid Other | Admitting: *Deleted

## 2022-02-02 ENCOUNTER — Other Ambulatory Visit: Payer: Self-pay | Admitting: *Deleted

## 2022-02-02 ENCOUNTER — Ambulatory Visit: Payer: Medicaid Other | Attending: Obstetrics

## 2022-02-02 ENCOUNTER — Other Ambulatory Visit: Payer: Self-pay

## 2022-02-02 VITALS — BP 108/72 | HR 85

## 2022-02-02 DIAGNOSIS — O3413 Maternal care for benign tumor of corpus uteri, third trimester: Secondary | ICD-10-CM | POA: Diagnosis not present

## 2022-02-02 DIAGNOSIS — D259 Leiomyoma of uterus, unspecified: Secondary | ICD-10-CM | POA: Diagnosis not present

## 2022-02-02 DIAGNOSIS — O09523 Supervision of elderly multigravida, third trimester: Secondary | ICD-10-CM

## 2022-02-02 DIAGNOSIS — Z3A31 31 weeks gestation of pregnancy: Secondary | ICD-10-CM | POA: Diagnosis not present

## 2022-02-02 DIAGNOSIS — Z6835 Body mass index (BMI) 35.0-35.9, adult: Secondary | ICD-10-CM

## 2022-02-02 DIAGNOSIS — O3410 Maternal care for benign tumor of corpus uteri, unspecified trimester: Secondary | ICD-10-CM | POA: Insufficient documentation

## 2022-03-02 ENCOUNTER — Ambulatory Visit: Payer: Medicaid Other | Admitting: *Deleted

## 2022-03-02 ENCOUNTER — Ambulatory Visit: Payer: Medicaid Other | Attending: Obstetrics

## 2022-03-02 VITALS — BP 99/60 | HR 117

## 2022-03-02 DIAGNOSIS — Z3A35 35 weeks gestation of pregnancy: Secondary | ICD-10-CM

## 2022-03-02 DIAGNOSIS — O3413 Maternal care for benign tumor of corpus uteri, third trimester: Secondary | ICD-10-CM | POA: Diagnosis not present

## 2022-03-02 DIAGNOSIS — O09523 Supervision of elderly multigravida, third trimester: Secondary | ICD-10-CM

## 2022-03-02 DIAGNOSIS — D259 Leiomyoma of uterus, unspecified: Secondary | ICD-10-CM | POA: Diagnosis not present

## 2022-03-08 LAB — OB RESULTS CONSOLE GBS: GBS: POSITIVE

## 2022-03-08 LAB — OB RESULTS CONSOLE GC/CHLAMYDIA
Chlamydia: NEGATIVE
Gonorrhea: NEGATIVE

## 2022-03-09 ENCOUNTER — Ambulatory Visit: Payer: Medicaid Other | Attending: Obstetrics

## 2022-03-09 ENCOUNTER — Ambulatory Visit: Payer: Medicaid Other | Admitting: *Deleted

## 2022-03-09 VITALS — BP 107/77 | HR 89

## 2022-03-09 DIAGNOSIS — O09523 Supervision of elderly multigravida, third trimester: Secondary | ICD-10-CM

## 2022-03-09 DIAGNOSIS — Z3A36 36 weeks gestation of pregnancy: Secondary | ICD-10-CM | POA: Diagnosis not present

## 2022-03-09 DIAGNOSIS — O3413 Maternal care for benign tumor of corpus uteri, third trimester: Secondary | ICD-10-CM | POA: Diagnosis not present

## 2022-03-09 DIAGNOSIS — D259 Leiomyoma of uterus, unspecified: Secondary | ICD-10-CM

## 2022-03-16 ENCOUNTER — Ambulatory Visit: Payer: Medicaid Other | Attending: Obstetrics

## 2022-03-16 ENCOUNTER — Ambulatory Visit: Payer: Medicaid Other | Admitting: *Deleted

## 2022-03-16 VITALS — BP 112/69 | HR 87

## 2022-03-16 DIAGNOSIS — O09523 Supervision of elderly multigravida, third trimester: Secondary | ICD-10-CM | POA: Insufficient documentation

## 2022-03-16 DIAGNOSIS — Z3A37 37 weeks gestation of pregnancy: Secondary | ICD-10-CM | POA: Diagnosis not present

## 2022-03-16 DIAGNOSIS — D259 Leiomyoma of uterus, unspecified: Secondary | ICD-10-CM

## 2022-03-16 DIAGNOSIS — O3413 Maternal care for benign tumor of corpus uteri, third trimester: Secondary | ICD-10-CM

## 2022-03-23 ENCOUNTER — Ambulatory Visit: Payer: Medicaid Other | Admitting: *Deleted

## 2022-03-23 ENCOUNTER — Encounter: Payer: Self-pay | Admitting: *Deleted

## 2022-03-23 ENCOUNTER — Ambulatory Visit: Payer: Medicaid Other | Attending: Obstetrics

## 2022-03-23 VITALS — BP 128/74 | HR 75

## 2022-03-23 DIAGNOSIS — Z3A38 38 weeks gestation of pregnancy: Secondary | ICD-10-CM | POA: Diagnosis not present

## 2022-03-23 DIAGNOSIS — O09523 Supervision of elderly multigravida, third trimester: Secondary | ICD-10-CM | POA: Insufficient documentation

## 2022-03-23 DIAGNOSIS — D259 Leiomyoma of uterus, unspecified: Secondary | ICD-10-CM

## 2022-03-23 DIAGNOSIS — O3413 Maternal care for benign tumor of corpus uteri, third trimester: Secondary | ICD-10-CM

## 2022-03-28 ENCOUNTER — Other Ambulatory Visit: Payer: Self-pay

## 2022-03-28 ENCOUNTER — Inpatient Hospital Stay (HOSPITAL_COMMUNITY)
Admission: AD | Admit: 2022-03-28 | Discharge: 2022-03-29 | DRG: 807 | Disposition: A | Discharge status: Home / Self Care | Payer: Medicaid Other | Attending: Obstetrics and Gynecology | Admitting: Obstetrics and Gynecology

## 2022-03-28 ENCOUNTER — Encounter (HOSPITAL_COMMUNITY): Payer: Self-pay | Admitting: Obstetrics and Gynecology

## 2022-03-28 ENCOUNTER — Inpatient Hospital Stay (HOSPITAL_COMMUNITY): Payer: Medicaid Other

## 2022-03-28 DIAGNOSIS — Z3A39 39 weeks gestation of pregnancy: Secondary | ICD-10-CM

## 2022-03-28 DIAGNOSIS — O99824 Streptococcus B carrier state complicating childbirth: Secondary | ICD-10-CM | POA: Diagnosis present

## 2022-03-28 DIAGNOSIS — O3413 Maternal care for benign tumor of corpus uteri, third trimester: Secondary | ICD-10-CM | POA: Diagnosis present

## 2022-03-28 DIAGNOSIS — Z3490 Encounter for supervision of normal pregnancy, unspecified, unspecified trimester: Secondary | ICD-10-CM | POA: Diagnosis present

## 2022-03-28 DIAGNOSIS — Z87891 Personal history of nicotine dependence: Secondary | ICD-10-CM | POA: Diagnosis not present

## 2022-03-28 DIAGNOSIS — O09529 Supervision of elderly multigravida, unspecified trimester: Secondary | ICD-10-CM

## 2022-03-28 DIAGNOSIS — O26893 Other specified pregnancy related conditions, third trimester: Principal | ICD-10-CM | POA: Diagnosis present

## 2022-03-28 LAB — CBC
HCT: 30.8 % — ABNORMAL LOW (ref 36.0–46.0)
Hemoglobin: 10.7 g/dL — ABNORMAL LOW (ref 12.0–15.0)
MCH: 32.9 pg (ref 26.0–34.0)
MCHC: 34.7 g/dL (ref 30.0–36.0)
MCV: 94.8 fL (ref 80.0–100.0)
Platelets: 259 10*3/uL (ref 150–400)
RBC: 3.25 MIL/uL — ABNORMAL LOW (ref 3.87–5.11)
RDW: 13.8 % (ref 11.5–15.5)
WBC: 8.9 10*3/uL (ref 4.0–10.5)
nRBC: 0 % (ref 0.0–0.2)

## 2022-03-28 LAB — TYPE AND SCREEN
ABO/RH(D): O POS
Antibody Screen: NEGATIVE

## 2022-03-28 LAB — RPR: RPR Ser Ql: NONREACTIVE

## 2022-03-28 MED ORDER — TERBUTALINE SULFATE 1 MG/ML IJ SOLN: 0.2500 mg | Freq: Once | INTRAMUSCULAR | Status: DC | PRN

## 2022-03-28 MED ORDER — ONDANSETRON HCL 4 MG/2ML IJ SOLN: 4.0000 mg | INTRAMUSCULAR | Status: DC | PRN

## 2022-03-28 MED ORDER — SODIUM CHLORIDE 0.9 % IV SOLN
5.0000 10*6.[IU] | Freq: Once | INTRAVENOUS | Status: AC
  Administered 2022-03-28: 5 10*6.[IU] via INTRAVENOUS
  Filled 2022-03-28: qty 5

## 2022-03-28 MED ORDER — ONDANSETRON HCL 4 MG/2ML IJ SOLN: 4.0000 mg | Freq: Four times a day (QID) | INTRAMUSCULAR | Status: DC | PRN

## 2022-03-28 MED ORDER — PENICILLIN G POT IN DEXTROSE 60000 UNIT/ML IV SOLN
3.0000 10*6.[IU] | INTRAVENOUS | Status: DC
  Administered 2022-03-28: 3 10*6.[IU] via INTRAVENOUS
  Filled 2022-03-28: qty 50

## 2022-03-28 MED ORDER — SENNOSIDES-DOCUSATE SODIUM 8.6-50 MG PO TABS
2.0000 | ORAL_TABLET | Freq: Every day | ORAL | Status: DC
  Administered 2022-03-29: 2 via ORAL
  Filled 2022-03-28: qty 2

## 2022-03-28 MED ORDER — FENTANYL CITRATE (PF) 100 MCG/2ML IJ SOLN: 50.0000 ug | Freq: Once | INTRAMUSCULAR | Status: DC

## 2022-03-28 MED ORDER — PHENYLEPHRINE 80 MCG/ML (10ML) SYRINGE FOR IV PUSH (FOR BLOOD PRESSURE SUPPORT)
80.0000 ug | PREFILLED_SYRINGE | INTRAVENOUS | Status: DC | PRN
  Filled 2022-03-28: qty 10

## 2022-03-28 MED ORDER — BENZOCAINE-MENTHOL 20-0.5 % EX AERO
1.0000 "application " | INHALATION_SPRAY | CUTANEOUS | Status: DC | PRN
  Administered 2022-03-28: 1 via TOPICAL
  Filled 2022-03-28: qty 56

## 2022-03-28 MED ORDER — WITCH HAZEL-GLYCERIN EX PADS: 1.0000 "application " | MEDICATED_PAD | CUTANEOUS | Status: DC | PRN

## 2022-03-28 MED ORDER — ACETAMINOPHEN 325 MG PO TABS
650.0000 mg | ORAL_TABLET | ORAL | Status: DC | PRN
Start: 2022-03-28 — End: 2022-03-29

## 2022-03-28 MED ORDER — OXYCODONE-ACETAMINOPHEN 5-325 MG PO TABS: 2.0000 | ORAL_TABLET | ORAL | Status: DC | PRN

## 2022-03-28 MED ORDER — OXYTOCIN BOLUS FROM INFUSION
333.0000 mL | Freq: Once | INTRAVENOUS | Status: AC
  Administered 2022-03-28: 333 mL via INTRAVENOUS

## 2022-03-28 MED ORDER — EPHEDRINE 5 MG/ML INJ: 10.0000 mg | INTRAVENOUS | Status: DC | PRN

## 2022-03-28 MED ORDER — PHENYLEPHRINE 80 MCG/ML (10ML) SYRINGE FOR IV PUSH (FOR BLOOD PRESSURE SUPPORT): 80.0000 ug | PREFILLED_SYRINGE | INTRAVENOUS | Status: DC | PRN

## 2022-03-28 MED ORDER — ZOLPIDEM TARTRATE 5 MG PO TABS: 5.0000 mg | ORAL_TABLET | Freq: Every evening | ORAL | Status: DC | PRN

## 2022-03-28 MED ORDER — PRENATAL MULTIVITAMIN CH
1.0000 | ORAL_TABLET | Freq: Every day | ORAL | Status: DC
Start: 2022-03-29 — End: 2022-03-29
  Administered 2022-03-29: 1 via ORAL
  Filled 2022-03-28: qty 1

## 2022-03-28 MED ORDER — LACTATED RINGERS IV SOLN
500.0000 mL | Freq: Once | INTRAVENOUS | Status: AC
  Administered 2022-03-28: 500 mL via INTRAVENOUS

## 2022-03-28 MED ORDER — IBUPROFEN 600 MG PO TABS
600.0000 mg | ORAL_TABLET | Freq: Four times a day (QID) | ORAL | Status: DC
  Administered 2022-03-28 – 2022-03-29 (×4): 600 mg via ORAL
  Filled 2022-03-28 (×4): qty 1

## 2022-03-28 MED ORDER — SOD CITRATE-CITRIC ACID 500-334 MG/5ML PO SOLN: 30.0000 mL | ORAL | Status: DC | PRN

## 2022-03-28 MED ORDER — FENTANYL-BUPIVACAINE-NACL 0.5-0.125-0.9 MG/250ML-% EP SOLN
12.0000 mL/h | EPIDURAL | Status: DC | PRN
  Filled 2022-03-28: qty 250

## 2022-03-28 MED ORDER — TETANUS-DIPHTH-ACELL PERTUSSIS 5-2.5-18.5 LF-MCG/0.5 IM SUSY: 0.5000 mL | PREFILLED_SYRINGE | Freq: Once | INTRAMUSCULAR | Status: DC

## 2022-03-28 MED ORDER — OXYTOCIN-SODIUM CHLORIDE 30-0.9 UT/500ML-% IV SOLN: 2.5000 [IU]/h | INTRAVENOUS | Status: DC

## 2022-03-28 MED ORDER — COCONUT OIL OIL
1.0000 | TOPICAL_OIL | Status: DC | PRN
Start: 2022-03-28 — End: 2022-03-29

## 2022-03-28 MED ORDER — LACTATED RINGERS IV SOLN: 500.0000 mL | INTRAVENOUS | Status: DC | PRN

## 2022-03-28 MED ORDER — OXYCODONE HCL 5 MG PO TABS: 10.0000 mg | ORAL_TABLET | ORAL | Status: DC | PRN

## 2022-03-28 MED ORDER — OXYCODONE-ACETAMINOPHEN 5-325 MG PO TABS: 1.0000 | ORAL_TABLET | ORAL | Status: DC | PRN

## 2022-03-28 MED ORDER — SIMETHICONE 80 MG PO CHEW: 80.0000 mg | CHEWABLE_TABLET | ORAL | Status: DC | PRN

## 2022-03-28 MED ORDER — DIPHENHYDRAMINE HCL 50 MG/ML IJ SOLN: 12.5000 mg | INTRAMUSCULAR | Status: DC | PRN

## 2022-03-28 MED ORDER — OXYTOCIN-SODIUM CHLORIDE 30-0.9 UT/500ML-% IV SOLN
1.0000 m[IU]/min | INTRAVENOUS | Status: DC
  Administered 2022-03-28: 2 m[IU]/min via INTRAVENOUS
  Filled 2022-03-28: qty 500

## 2022-03-28 MED ORDER — DIBUCAINE (PERIANAL) 1 % EX OINT: 1.0000 "application " | TOPICAL_OINTMENT | CUTANEOUS | Status: DC | PRN

## 2022-03-28 MED ORDER — MISOPROSTOL 50MCG HALF TABLET: 50.0000 ug | ORAL_TABLET | ORAL | Status: DC | PRN

## 2022-03-28 MED ORDER — LIDOCAINE HCL (PF) 1 % IJ SOLN
30.0000 mL | INTRAMUSCULAR | Status: DC | PRN
  Filled 2022-03-28: qty 30

## 2022-03-28 MED ORDER — LACTATED RINGERS IV SOLN: INTRAVENOUS | Status: DC

## 2022-03-28 MED ORDER — FENTANYL CITRATE (PF) 100 MCG/2ML IJ SOLN
50.0000 ug | INTRAMUSCULAR | Status: DC | PRN
  Administered 2022-03-28: 50 ug via INTRAVENOUS
  Filled 2022-03-28 (×3): qty 2

## 2022-03-28 MED ORDER — ACETAMINOPHEN 325 MG PO TABS: 650.0000 mg | ORAL_TABLET | ORAL | Status: DC | PRN

## 2022-03-28 MED ORDER — OXYCODONE HCL 5 MG PO TABS: 5.0000 mg | ORAL_TABLET | ORAL | Status: DC | PRN

## 2022-03-28 MED ORDER — ONDANSETRON HCL 4 MG PO TABS: 4.0000 mg | ORAL_TABLET | ORAL | Status: DC | PRN

## 2022-03-28 MED ORDER — DIPHENHYDRAMINE HCL 25 MG PO CAPS: 25.0000 mg | ORAL_CAPSULE | Freq: Four times a day (QID) | ORAL | Status: DC | PRN

## 2022-03-28 NOTE — Discharge Summary (Signed)
Postpartum Discharge Summary     Patient Name: Krystal Gonzalez DOB: [birth year] MRN: [id number]  Date of admission: 03/28/2022 Delivery date:03/28/2022  Delivering provider: Fatima Blank A  Date of discharge: 03/29/2022  Admitting diagnosis: Encounter for induction of labor [Z34.90] Intrauterine pregnancy: [redacted]w[redacted]d    Secondary diagnosis:  Principal Problem:   Vaginal delivery Active Problems:   Encounter for induction of labor   Advanced maternal age in multigravida  Additional problems: None   Discharge diagnosis: Term Pregnancy Delivered                                              Post partum procedures: None Augmentation: Pitocin Complications: None  Hospital course: Induction of Labor With Vaginal Delivery   41y.o. yo [id number] 339w0das admitted to the hospital 03/28/2022 for induction of labor.  Indication for induction:  AMA over age 42  Patient had an uncomplicated labor course as follows: Membrane Rupture Time/Date: 1:03 PM ,03/28/2022   Delivery Method:Vaginal, Spontaneous  Episiotomy: None  Lacerations:  1st degree;Perineal  Details of delivery can be found in separate delivery note.  Patient had a routine postpartum course.  She is eating, drinking, voiding, and ambulating without issue.  Her pain and bleeding are controlled.  She is formula feeding well.  Patient is discharged home 03/29/22.  Newborn Data: Birth date:03/28/2022  Birth time:1:04 PM  Gender:Female  Living status:Living  Apgars:9 ,9  Weight:2830 g   Magnesium Sulfate received: No BMZ received: No Rhophylac: N/A MMR: N/A T-DaP: Given prenatally Flu: No Transfusion: No  Physical exam  Vitals:   03/28/22 1630 03/28/22 2032 03/28/22 2333 03/29/22 0400  BP: 116/73 130/75 124/78 120/76  Pulse: 81 79 68 72  Resp: 18     Temp: 98 F (36.7 C)     TempSrc:      SpO2: 100%     Weight:      Height:       General: alert, cooperative, and no distress Lochia: appropriate Uterine  Fundus: firm and below umbilicus  DVT Evaluation: no LE edema or calf tenderness to palpation   Labs: Lab Results  Component Value Date   WBC 8.9 03/28/2022   HGB 10.7 (L) 03/28/2022   HCT 30.8 (L) 03/28/2022   MCV 94.8 03/28/2022   PLT 259 03/28/2022      Latest Ref Rng & Units 10/28/2021    2:24 PM  CMP  Glucose 70 - 99 mg/dL 76    BUN 6 - 20 mg/dL 12    Creatinine 0.44 - 1.00 mg/dL 0.54    Sodium 135 - 145 mmol/L 134    Potassium 3.5 - 5.1 mmol/L 3.6    Chloride 98 - 111 mmol/L 101    CO2 22 - 32 mmol/L 25    Calcium 8.9 - 10.3 mg/dL 9.5    Total Protein 6.5 - 8.1 g/dL 6.8    Total Bilirubin 0.3 - 1.2 mg/dL 0.3    Alkaline Phos 38 - 126 U/L 36    AST 15 - 41 U/L 19    ALT 0 - 44 U/L 24     Edinburgh Score:    03/28/2022    3:20 PM  Edinburgh Postnatal Depression Scale Screening Tool  I have been able to laugh and see the funny side of things. 0  I have looked  forward with enjoyment to things. 0  I have blamed myself unnecessarily when things went wrong. 0  I have been anxious or worried for no good reason. 0  I have felt scared or panicky for no good reason. 0  Things have been getting on top of me. 0  I have been so unhappy that I have had difficulty sleeping. 0  I have felt sad or miserable. 0  I have been so unhappy that I have been crying. 0  The thought of harming myself has occurred to me. 0  Edinburgh Postnatal Depression Scale Total 0     After visit meds:  Allergies as of 03/29/2022   No Known Allergies      Medication List     STOP taking these medications    omeprazole 20 MG capsule Commonly known as: PRILOSEC       TAKE these medications    acetaminophen 500 MG tablet Commonly known as: TYLENOL Take 2 tablets (1,000 mg total) by mouth every 8 (eight) hours as needed (pain).   ibuprofen 600 MG tablet Commonly known as: ADVIL Take 1 tablet (600 mg total) by mouth every 6 (six) hours as needed (pain).   multivitamin-prenatal 27-0.8  MG Tabs tablet Take 1 tablet by mouth daily at 12 noon.         Discharge home in stable condition Infant Feeding: Bottle Infant Disposition: home with mother Discharge instruction: per After Visit Summary and Postpartum booklet. Activity: Advance as tolerated. Pelvic rest for 6 weeks.  Diet: routine diet  Follow up Visit: Patient to follow up at [facility] for postpartum visit in 4-6 weeks.  03/29/2022 Genia Del, MD

## 2022-03-28 NOTE — H&P (Signed)
OBSTETRIC ADMISSION HISTORY AND PHYSICAL ? ?Krystal Gonzalez is a 41 y.o. female [id number] with IUP at 65w0dby 22 week UKoreapresenting for IOL due to [place]ock She reports +FMs, no LOF, no VB, no blurry vision, headaches, peripheral edema, or RUQ pain.  She plans on bottle feeding. She is planning for outpatient Mirena IUD for birth control postpartum. ? ?[facility] ? ?Dating: By [place]--->  Estimated Date of Delivery: 04/04/22 ? ?Sono:   ?'@[redacted]w[redacted]d'$ , normal anatomy, cephalic presentation, posterior placental lie, 2580 g, 41% EFW ? ?Prenatal History/Complications:  ?AMA ?Uterine fibroids  ? ?Past Medical History: ?Past Medical History:  ?Diagnosis Date  ? Medical history non-contributory   ? ? ?Past Surgical History: ?Past Surgical History:  ?Procedure Laterality Date  ? NO PAST SURGERIES    ? ? ?Obstetrical History: ?OB History   ? ? Gravida  ?5  ? Para  ?4  ? Term  ?4  ? Preterm  ?   ? AB  ?   ? Living  ?4  ?  ? ? SAB  ?   ? IAB  ?   ? Ectopic  ?   ? Multiple  ?   ? Live Births  ?4  ?   ?  ?  ? ? ?Social History ?Social History  ? ?Socioeconomic History  ? Marital status: Single  ?  Spouse name: Not on file  ? Number of children: Not on file  ? Years of education: Not on file  ? Highest education level: Not on file  ?Occupational History  ? Not on file  ?Tobacco Use  ? Smoking status: Former  ?  Types: Cigars  ? Smokeless tobacco: Never  ?Vaping Use  ? Vaping Use: Never used  ?Substance and Sexual Activity  ? Alcohol use: Not Currently  ? Drug use: No  ? Sexual activity: Not on file  ?Other Topics Concern  ? Not on file  ?Social History Narrative  ? Not on file  ? ?Social Determinants of Health  ? ?Financial Resource Strain: Not on file  ?Food Insecurity: Not on file  ?Transportation Needs: Not on file  ?Physical Activity: Not on file  ?Stress: Not on file  ?Social Connections: Not on file  ? ? ?Family History: ?Family History  ?Problem Relation Age of Onset  ? Hypertension Mother   ? ? ?Allergies: ?No  Known Allergies ? ?Medications Prior to Admission  ?Medication Sig Dispense Refill Last Dose  ? omeprazole (PRILOSEC) 20 MG capsule Take 20 mg by mouth daily.     ? Prenatal Vit-Fe Fumarate-FA (MULTIVITAMIN-PRENATAL) 27-0.8 MG TABS tablet Take 1 tablet by mouth daily at 12 noon.     ? ? ? ?Review of Systems  ?All systems reviewed and negative except as stated in HPI ? ?Blood pressure 115/73, pulse 85, temperature 98.8 ?F (37.1 ?C), temperature source Oral, resp. rate 16, height '5\' 4"'$  (1.626 m), weight 97.9 kg, last menstrual period 07/04/2021. ? ?General appearance: alert, cooperative, and no distress ?Lungs: normal work of breathing on room air  ?Heart: normal rate, warm and well perfused  ?Abdomen: soft, non-tender, gravid  ?Extremities: no LE edema or calf tenderness  ? ?Presentation: Cephalic ?Fetal monitoring: Baseline 135 bpm, moderate variability, + accels, no decels  ?Uterine activity: Irregular contractions  ?Dilation: 2.5 ?Effacement (%): 70 ?Station: -3 ?Exam by:: Dr. AGwenlyn Perking? ?Prenatal labs: ?ABO, Rh: --/--/O POS (05/17 04680 ?Antibody: NEG (05/17 0719) ?Rubella: Immune (12/01 0000) ?RPR: Nonreactive (03/07  0000) ?HBsAg: Negative (12/01 0000) ?HIV: Non-reactive (03/07 0000) ?GBS: Positive/-- (04/27 0000) ?1 hr Glucola abnormal; 3 hr Glucola normal  ?Genetic screening - LR NIPS, [place] negative  ?Anatomy US normal  ? ?Prenatal Transfer Tool  ?Maternal Diabetes: No ?Genetic Screening: Normal ?Maternal Ultrasounds/Referrals: Normal ?Fetal Ultrasounds or other Referrals:  None ?Maternal Substance Abuse:  No ?Significant Maternal Medications:  None ?Significant Maternal Lab Results: Group B Strep positive ? ?Results for orders placed or performed during the hospital encounter of 03/28/22 (from the past 24 hour(s))  ?CBC  ? Collection Time: 03/28/22  7:19 AM  ?Result Value Ref Range  ? WBC 8.9 4.0 - 10.5 K/uL  ? RBC 3.25 (L) 3.87 - 5.11 MIL/uL  ? Hemoglobin 10.7 (L) 12.0 - 15.0 g/dL  ? HCT 30.8 (L) 36.0 -  46.0 %  ? MCV 94.8 80.0 - 100.0 fL  ? MCH 32.9 26.0 - 34.0 pg  ? MCHC 34.7 30.0 - 36.0 g/dL  ? RDW 13.8 11.5 - 15.5 %  ? Platelets 259 150 - 400 K/uL  ? nRBC 0.0 0.0 - 0.2 %  ?Type and screen [place]  ? Collection Time: 03/28/22  7:19 AM  ?Result Value Ref Range  ? ABO/RH(D) O POS   ? Antibody Screen NEG   ? Sample Expiration    ?  03/31/2022,2359 ?Performed at [facility], [place] [address] [place], [place] [address] ?  ? ? ?Patient Active Problem List  ? Diagnosis Date Noted  ? Encounter for induction of labor 03/28/2022  ? ?Assessment/Plan:  ?[name] is a 41 y.o. G5P4004 at 85w0dhere for IOL due to [place] ? ?#Labor: Will start Pitocin 2x2 and reassess in 4 hours. Plan for AROM after adequate GBS prophylaxis.  ?#Pain: PRN ?#FWB: Cat 1 ?#ID:  GBS positive; PCN ordered ?#MOF: Bottle  ?#MOC: IUD outpatient  ?#Circ:  Desires inpatient  ? ?CGenia Del MD  ?03/28/2022, 8:44 AM ? ? ? ?

## 2022-03-29 MED ORDER — ACETAMINOPHEN 500 MG PO TABS: 1000.0000 mg | ORAL_TABLET | Freq: Three times a day (TID) | ORAL | 0 refills | Status: DC | PRN

## 2022-03-29 MED ORDER — IBUPROFEN 600 MG PO TABS: 600.0000 mg | ORAL_TABLET | Freq: Four times a day (QID) | ORAL | 0 refills | Status: DC | PRN

## 2022-04-05 ENCOUNTER — Telehealth (HOSPITAL_COMMUNITY): Payer: Self-pay | Admitting: *Deleted

## 2022-04-05 NOTE — Telephone Encounter (Signed)
Mom reports feeling good. No concerns about herself at this time. EPDS=0 [facility] score=0) Mom reports baby is doing well. Feeding, peeing, and pooping without difficulty. Safe sleep reviewed. Mom reports no concerns about baby at present.  Odis Hollingshead, RN 04-05-2022 at 4:16pm

## 2022-09-04 IMAGING — US US MFM OB DETAIL+14 WK
1 series · 13 of 28 positions shown · non-contrast
Comparison: none

[Series 1: us mfm ob detail+14 wk · 105 acquisitions, 13 frames shown]
[im 4/105]
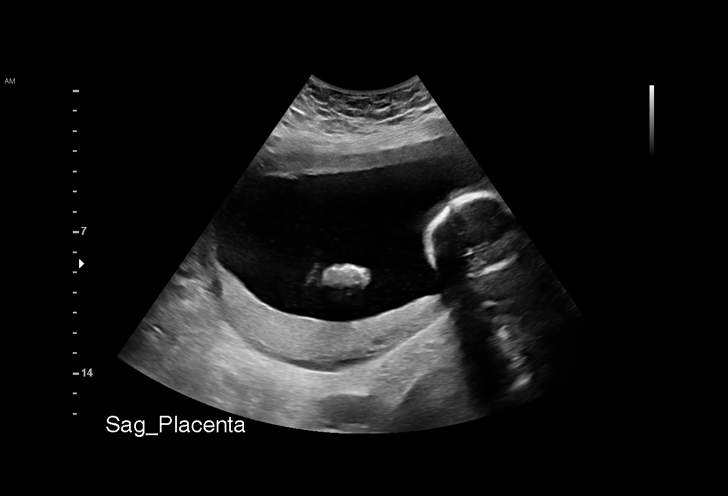
[im 12/105]
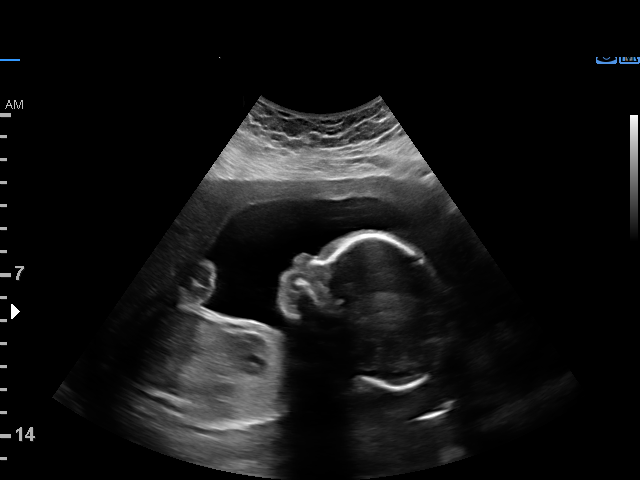
[im 20/105]
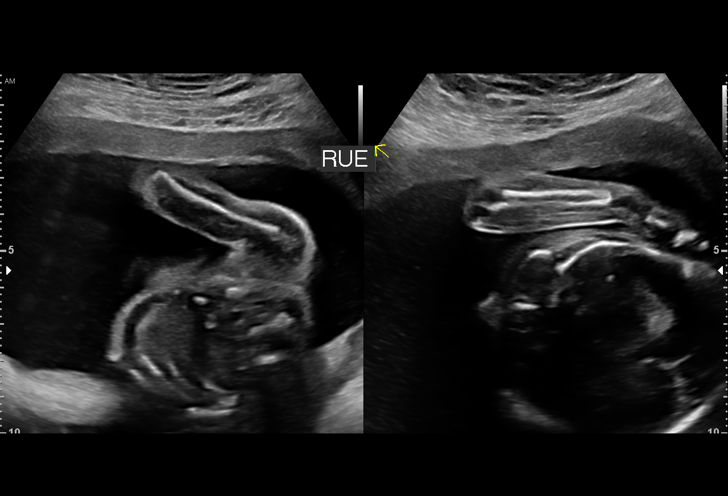
[im 27/105]
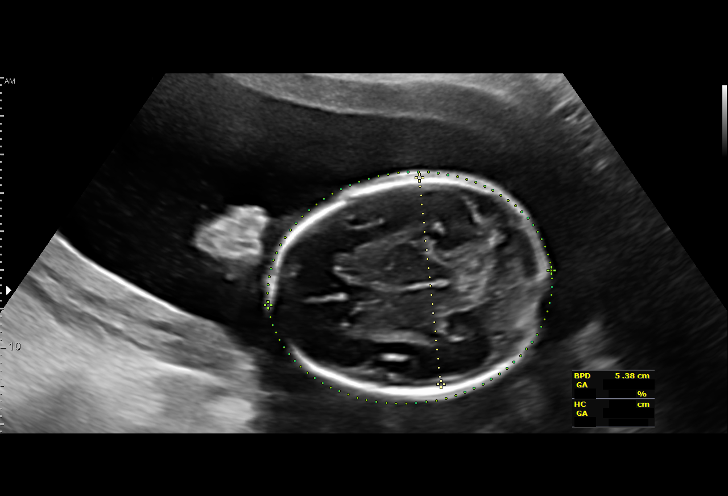
[im 35/105]
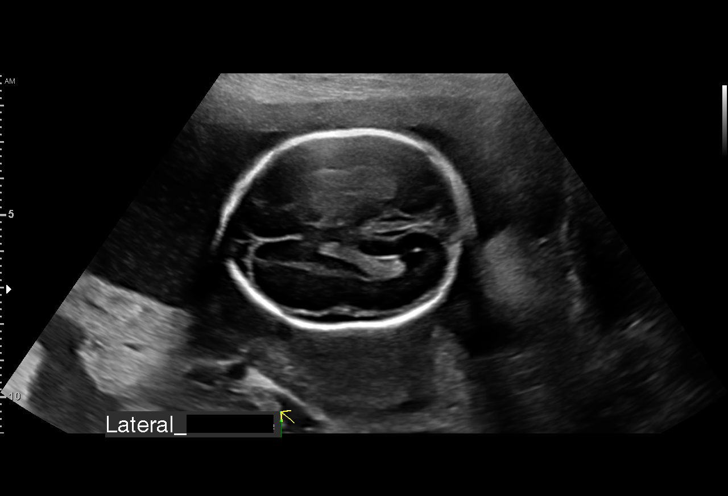
[im 43/105]
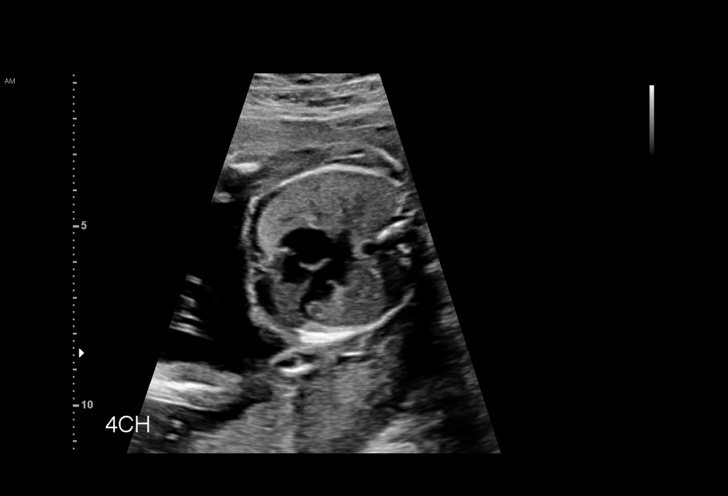
[im 54/105]
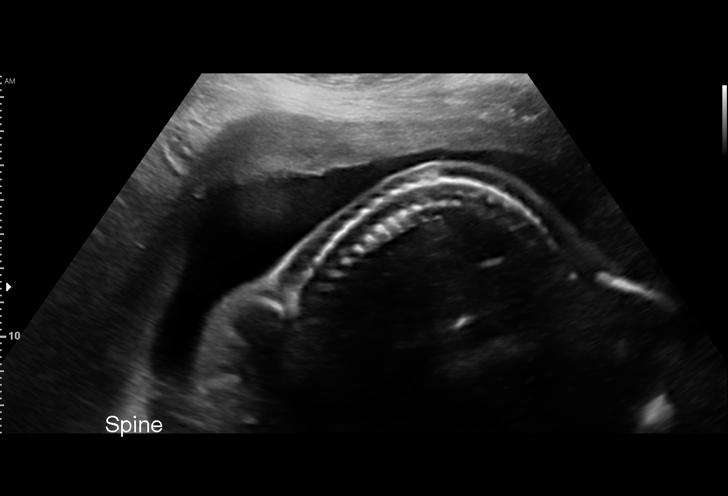
[im 62/105]
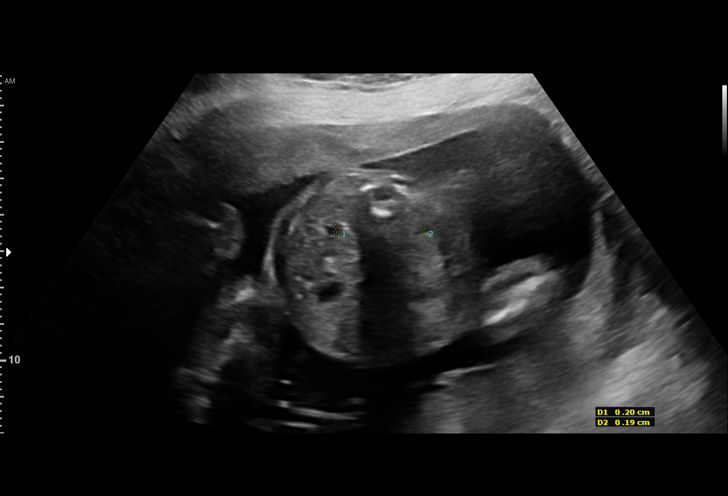
[im 70/105]
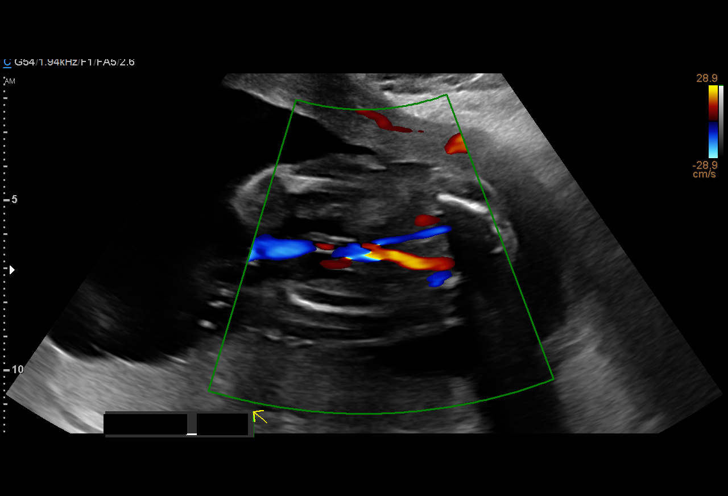
[im 78/105]
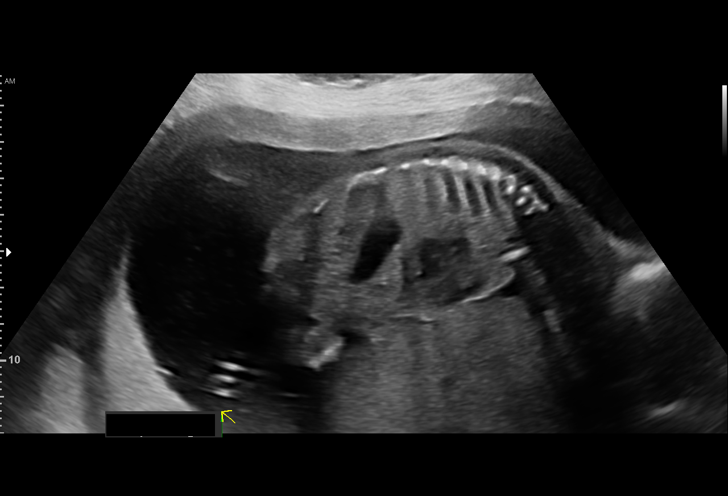
[im 85/105]
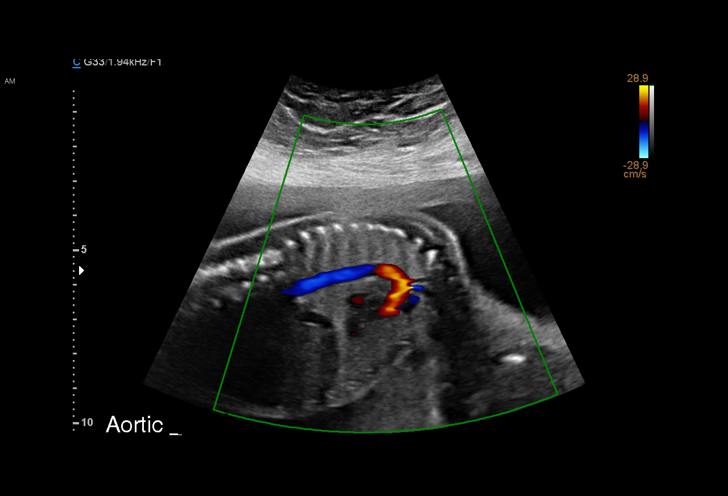
[im 93/105]
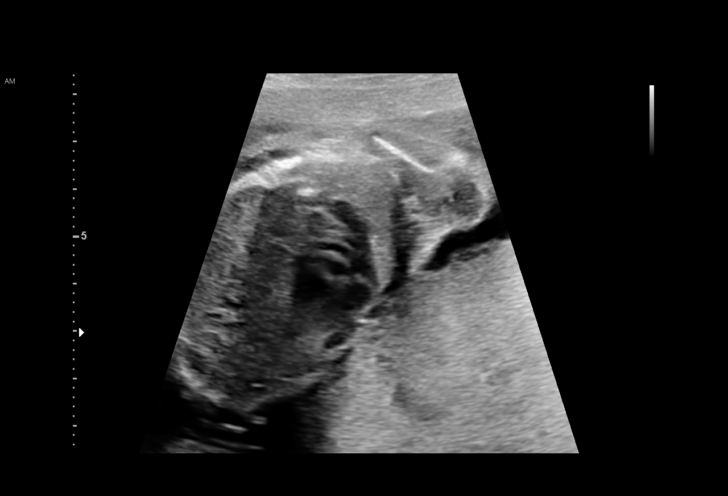
[im 101/105]
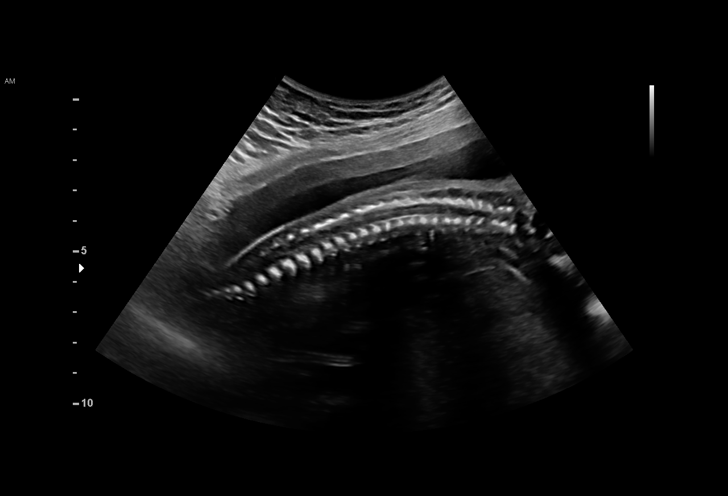

[13 of 28 positions shown; findings below may reference images not displayed]

SHAREIF NP

Indications

 Advanced maternal age multigravida 34+,
 second trimester
 22 weeks gestation of pregnancy
 Antenatal screening for malformations
 LR NIPS
Fetal Evaluation

 Num Of Fetuses:         1
 Fetal Heart Rate(bpm):  152
 Cardiac Activity:       Observed
 Presentation:           Cephalic
 Placenta:               Posterior
 P. Cord Insertion:      Visualized, central

 Amniotic Fluid
 AFI FV:      Within normal limits
Biometry

 BPD:        54  mm     G. Age:  22w 3d         29  %    CI:        68.42   %    70 - 86
                                                         FL/HC:      18.9   %    19.2 -
 HC:      208.7  mm     G. Age:  23w 0d         40  %    HC/AC:      1.12        1.05 -
 AC:       187   mm     G. Age:  23w 3d         62  %    FL/BPD:     73.0   %    71 - 87
 FL:       39.4  mm     G. Age:  22w 5d         33  %    FL/AC:      21.1   %    20 - 24
 CER:      24.9  mm     G. Age:  22w 5d         74  %
 NFT:       4.0  mm
 LV:        4.1  mm
 CM:        5.2  mm

 Est. FW:     561  gm      1 lb 4 oz     54  %
OB History

 Gravidity:    1         Term:   0        Prem:   0        SAB:   0
 TOP:          0       Ectopic:  0        Living: 0
Gestational Age

 LMP:           22w 0d        Date:  07/04/21                 EDD:   04/10/22
 U/S Today:     22w 6d                                        EDD:   04/04/22
 Best:          22w 6d     Det. By:  U/S (12/05/21)           EDD:   04/04/22
Anatomy

 Cranium:               Appears normal         Aortic Arch:            Appears normal
 Cavum:                 Appears normal         Ductal Arch:            Not well visualized
 Ventricles:            Appears normal         Diaphragm:              Appears normal
 Choroid Plexus:        Appears normal         Stomach:                Appears normal, left
                                                                       sided
 Cerebellum:            Appears normal         Abdomen:                Appears normal
 Posterior Fossa:       Appears normal         Abdominal Wall:         Appears nml (cord
                                                                       insert, abd wall)
 Nuchal Fold:           Appears normal         Cord Vessels:           Appears normal (3
                                                                       vessel cord)
 Face:                  Appears normal         Kidneys:                Appear normal
                        (orbits and profile)
 Lips:                  Appears normal         Bladder:                Appears normal
 Thoracic:              Appears normal         Spine:                  Appears normal
 Heart:                 Appears normal         Upper Extremities:      Appears normal
                        (4CH, axis, and
                        situs)
 RVOT:                  Not well visualized    Lower Extremities:      Appears normal
 LVOT:                  Appears normal

 Other:  VC, 3VV and 3VTV visualized. Heels/feet and open hands/5th digits
         visualized.
Cervix Uterus Adnexa

 Cervix
 Length:           3.32  cm.
 Normal appearance by transabdominal scan.

 Uterus
 No abnormality visualized.

 Right Ovary
 Within normal limits.

 Left Ovary
 Within normal limits.
Impression

 G5 P4.  Patient is here for fetal anatomy scan. Patient reports
 she is unsure of her LMP date.
 On cell-free fetal DNA screening, the risks of fetal
 aneuploidies are not increased .
 Obstetrical history significant for 4 term vaginal deliveries.

 We performed fetal anatomy scan. No makers of
 aneuploidies or fetal structural defects are seen. Fetal
 biometry is consistent with 22w 6d gestation. Amniotic fluid is
 normal and good fetal activity is seen. Patient understands
 the limitations of ultrasound in detecting fetal anomalies.
 We have assigned her EDD at 04/04/2022.
Recommendations

 -An appointment was made for her to return in 4 weeks for
 completion of fetal anatomy.
                 Hirsi, Kat

## 2022-11-02 IMAGING — US US MFM OB FOLLOW-UP
1 series · 14 of 28 positions shown · non-contrast
Comparison: none

[Series 1: us mfm ob follow-up · 53 acquisitions, 14 frames shown]
[im 2/53]
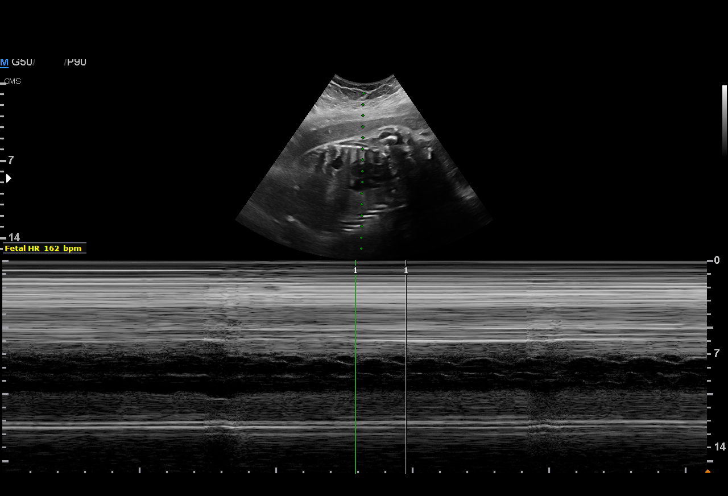
[im 6/53]
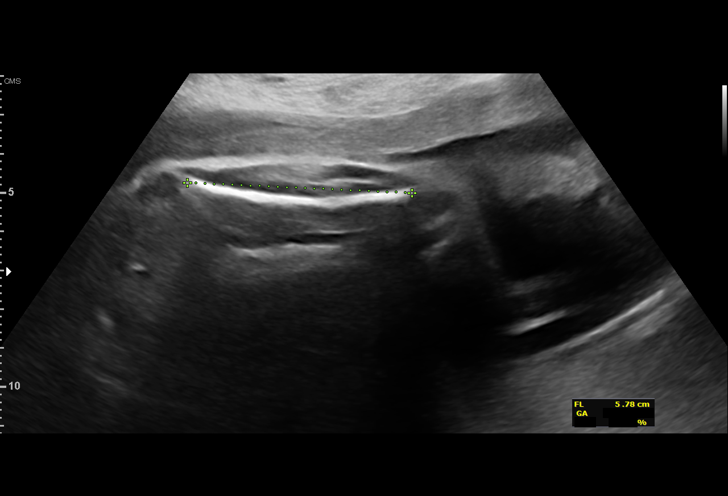
[im 10/53]
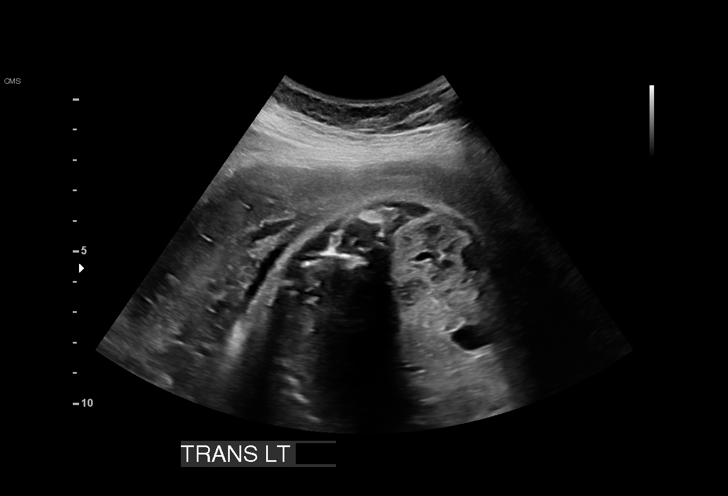
[im 14/53]
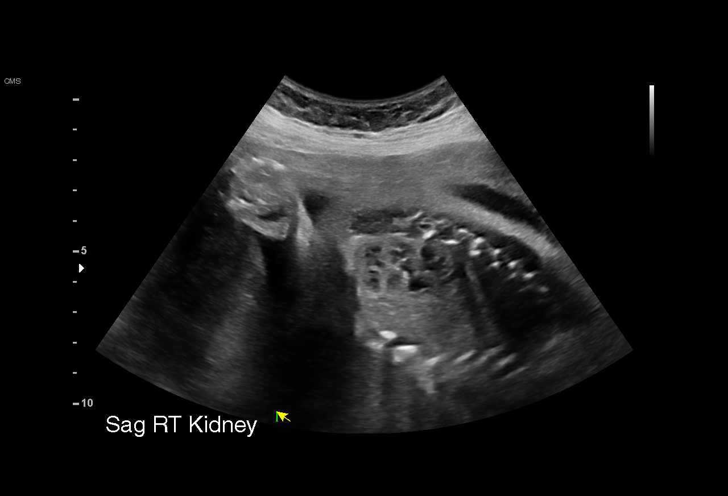
[im 18/53]
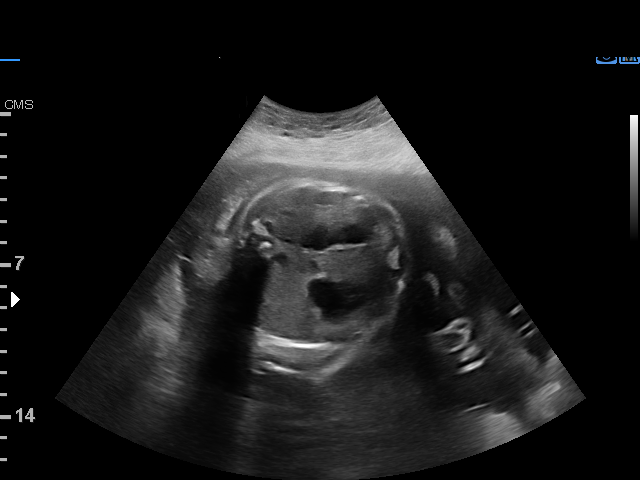
[im 22/53]
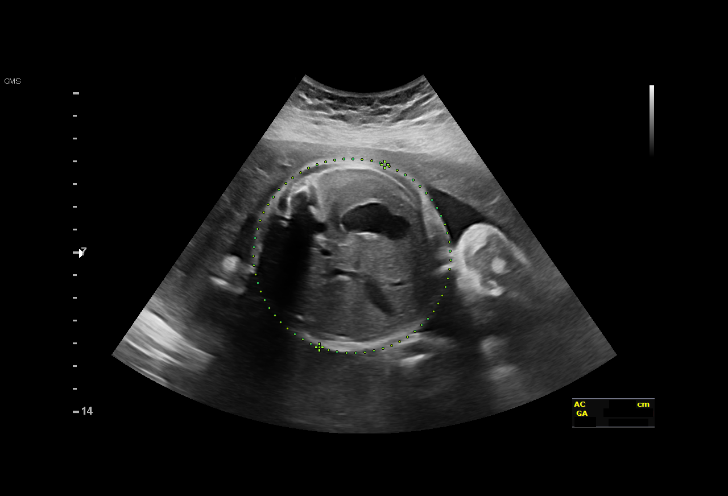
[im 26/53]
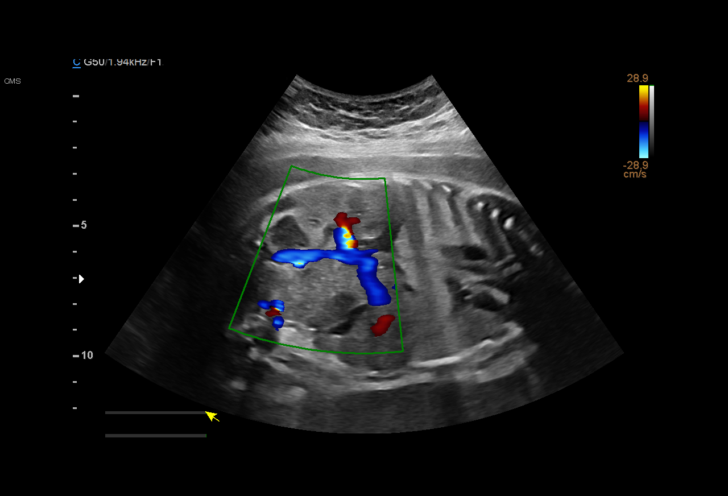
[im 29/53]
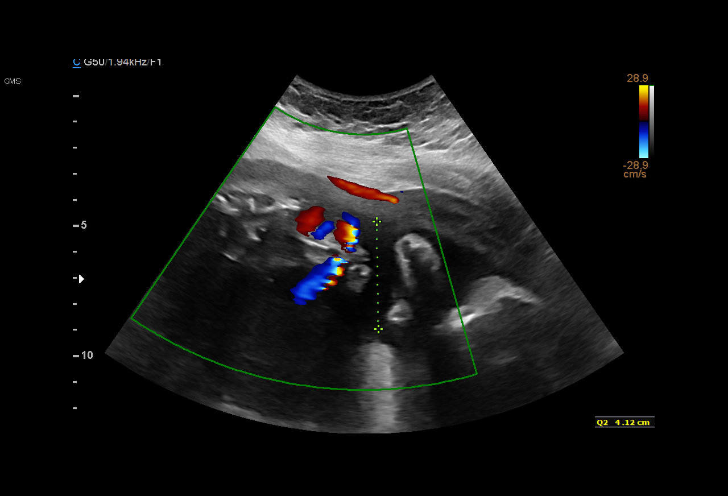
[im 33/53]
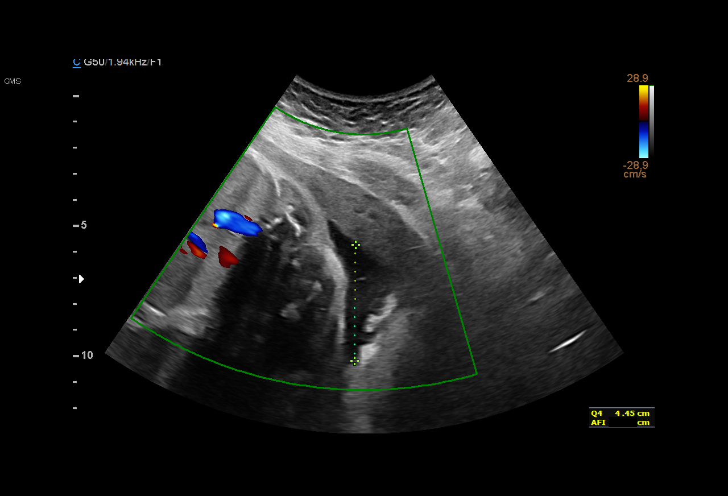
[im 37/53]
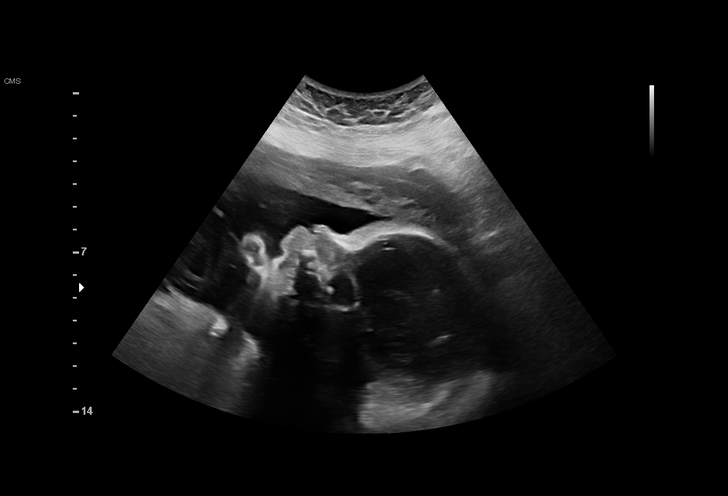
[im 41/53]
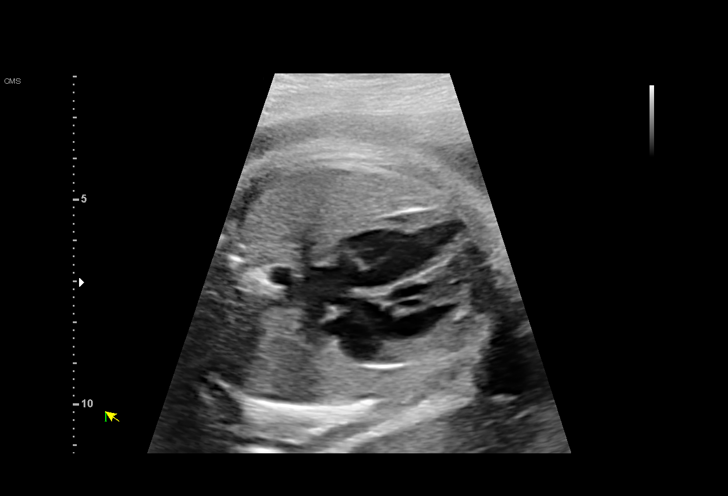
[im 45/53]
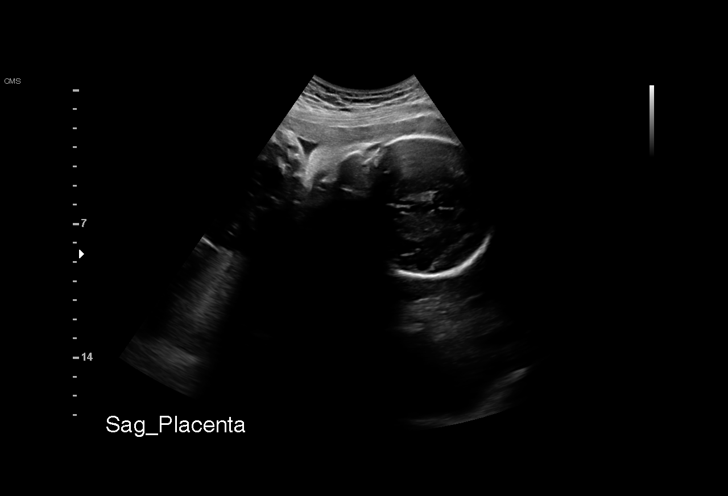
[im 49/53]
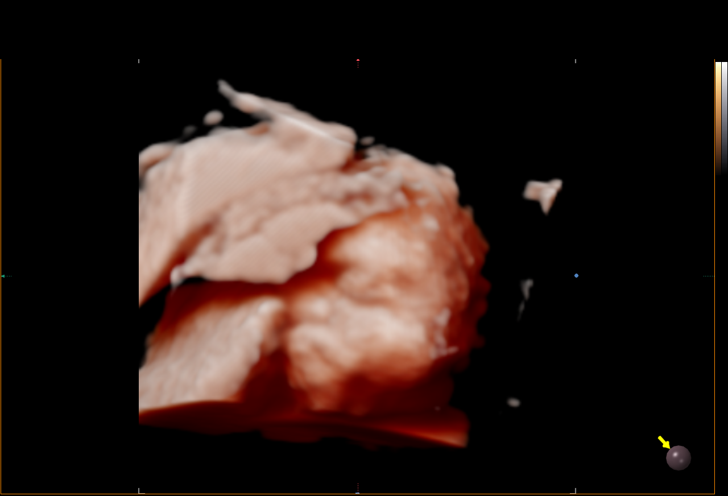
[im 53/53]
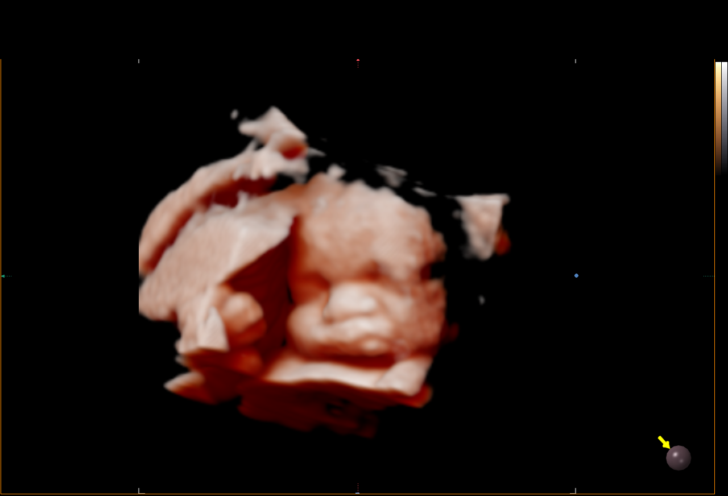

[14 of 28 positions shown; findings below may reference images not displayed]

MITHAN NP

Indications

 Advanced maternal age primigravida 40,
 third trimester
 Uterine fibroids affecting pregnancy in third  O34.13,
 trimester, antepartum
 31 weeks gestation of pregnancy
 LR NIPS
Fetal Evaluation

 Num Of Fetuses:         1
 Fetal Heart Rate(bpm):  162
 Cardiac Activity:       Observed
 Presentation:           Cephalic
 Placenta:               Posterior
 P. Cord Insertion:      Previously Visualized

 Amniotic Fluid
 AFI FV:      Within normal limits

 AFI Sum(cm)     %Tile       Largest Pocket(cm)
 16              58

 RUQ(cm)       RLQ(cm)       LUQ(cm)        LLQ(cm)

Biometry
 BPD:        74  mm     G. Age:  29w 5d          5  %    CI:        68.45   %    70 - 86
                                                         FL/HC:      20.4   %    19.3 -
 HC:      285.9  mm     G. Age:  31w 3d         18  %    HC/AC:      1.05        0.96 -
 AC:      273.1  mm     G. Age:  31w 3d         50  %    FL/BPD:     78.9   %    71 - 87
 FL:       58.4  mm     G. Age:  30w 4d         17  %    FL/AC:      21.4   %    20 - 24

 LV:        4.1  mm

 Est. FW:    5606  gm    3 lb 11 oz      28  %
OB History

 Gravidity:    1         Term:   0        Prem:   0        SAB:   0
 TOP:          0       Ectopic:  0        Living: 0
Gestational Age

 LMP:           30w 3d        Date:  07/04/21                 EDD:   04/10/22
 U/S Today:     30w 6d                                        EDD:   04/07/22
 Best:          31w 2d     Det. By:  U/S  (12/05/21)          EDD:   04/04/22
Anatomy

 Cranium:               Appears normal         Aortic Arch:            Previously seen
 Cavum:                 Appears normal         Ductal Arch:            Previously seen
 Ventricles:            Appears normal         Diaphragm:              Appears normal
 Choroid Plexus:        Previously seen        Stomach:                Appears normal, left
                                                                       sided
 Cerebellum:            Previously seen        Abdomen:                Appears normal
 Posterior Fossa:       Previously seen        Abdominal Wall:         Previously seen
 Nuchal Fold:           Previously seen        Cord Vessels:           Previously seen
 Face:                  Appears normal         Kidneys:                Appear normal
                        (orbits and profile)
 Lips:                  Appears normal         Bladder:                Appears normal
 Thoracic:              Appears normal         Spine:                  Previously seen
 Heart:                 Appears normal         Upper Extremities:      Previously seen
                        (4CH, axis, and
                        situs)
 RVOT:                  Previously seen        Lower Extremities:      Previously seen
 LVOT:                  Previously seen

 Other:  VC, 3VV and 3VTV prev visualized. Heels/feet and open hands/5th
         digits prev visualized.
Cervix Uterus Adnexa

 Cervix
 Not visualized (advanced GA >68wks)

 Uterus
 No abnormality visualized.

 Right Ovary
 Not visualized.

 Left Ovary
 Not visualized.
 Cul De Sac
 No free fluid seen.

 Adnexa
 No abnormality visualized.
Comments

 This patient was seen for a follow up growth scan due to
 advanced maternal age (40 years old) and a fibroid uterus.
 She denies any problems since her last exam.
 She was informed that the fetal growth and amniotic fluid
 level appears appropriate for her gestational age.
 A follow-up growth scan was scheduled in 4 weeks.
 As she is over the age of 40, we will start weekly BPP's at 36
 weeks.

## 2023-03-26 ENCOUNTER — Ambulatory Visit
Admission: EM | Admit: 2023-03-26 | Discharge: 2023-03-26 | Disposition: A | Discharge status: Home / Self Care | Payer: Medicaid Other | Attending: Internal Medicine | Admitting: Internal Medicine

## 2023-03-26 DIAGNOSIS — H109 Unspecified conjunctivitis: Secondary | ICD-10-CM | POA: Diagnosis not present

## 2023-03-26 DIAGNOSIS — J302 Other seasonal allergic rhinitis: Secondary | ICD-10-CM | POA: Diagnosis not present

## 2023-03-26 MED ORDER — CETIRIZINE HCL 10 MG PO TABS: 10.0000 mg | ORAL_TABLET | Freq: Every day | ORAL | 0 refills | Status: AC

## 2023-03-26 MED ORDER — FLUTICASONE PROPIONATE 50 MCG/ACT NA SUSP: 1.0000 | Freq: Every day | NASAL | 0 refills | Status: AC

## 2023-03-26 MED ORDER — ERYTHROMYCIN 5 MG/GM OP OINT: TOPICAL_OINTMENT | OPHTHALMIC | 0 refills | Status: AC

## 2023-03-26 NOTE — ED Triage Notes (Signed)
Patient with c/o left eye drainage and itchiness since yesterday. States she tried benadryl and otc decongestants because she is also "stopped up".

## 2023-03-26 NOTE — Discharge Instructions (Signed)
I have prescribed you a topical medication that will go in your eye to treat infection.  Change pillowcase and linen daily to prevent reinfection.  Recommending staying out of work for 24 hours after starting medication to prevent spread of infection.  I have prescribed you allergy medicine and nasal spray to help alleviate your nasal congestion.  Follow-up with allergy specialist if symptoms persist or worsen.

## 2023-03-26 NOTE — ED Provider Notes (Addendum)
[facility]    CSN: [id number] Arrival date & time: 03/26/23  1716      History   Chief Complaint Chief Complaint  Patient presents with   Conjunctivitis    HPI Krystal Gonzalez is a 42 y.o. female.   Patient presents with 2 different chief complaints today.  Reports that she has had left eye irritation, swelling, redness, drainage that started yesterday.  Reports purulent drainage from the eye.  Denies trauma or foreign body to the eye.  Patient reports that she does not wear contact lenses or glasses.  Denies blurry vision.  Patient also reporting issues with her allergies for several months since the seasons changed. Reports nasal congestion.  Reports this is a recurrent issue for her.  She has been taking Benadryl and Afrin with no improvement in symptoms.  Denies any associated fever.   Conjunctivitis    Past Medical History:  Diagnosis Date   Medical history non-contributory     Patient Active Problem List   Diagnosis Date Noted   Encounter for induction of labor 03/28/2022   Vaginal delivery 03/28/2022   Advanced maternal age in multigravida 03/28/2022    Past Surgical History:  Procedure Laterality Date   NO PAST SURGERIES      OB History     Gravida  5   Para  5   Term  5   Preterm      AB      Living  5      SAB      IAB      Ectopic      Multiple  0   Live Births  5            Home Medications    Prior to Admission medications   Medication Sig Start Date End Date Taking? Authorizing Provider  cetirizine (ZYRTEC) 10 MG tablet Take 1 tablet (10 mg total) by mouth daily. 03/26/23  Yes Ashar Lewinski, Rolly Salter E, FNP  erythromycin ophthalmic ointment Place a 1/2 inch ribbon of ointment into the lower eyelid 4 times daily for 7 days. 03/26/23  Yes Dareion Kneece, Rolly Salter E, FNP  fluticasone (FLONASE) 50 MCG/ACT nasal spray Place 1 spray into both nostrils daily. 03/26/23  Yes Javonte Elenes, Acie Fredrickson, FNP  omeprazole (PRILOSEC OTC) 20 MG tablet Take 20  mg by mouth daily.    [provider]    Family History Family History  Problem Relation Age of Onset   Hypertension Mother     Social History Social History   Tobacco Use   Smoking status: Former    Types: Cigars   Smokeless tobacco: Never  Vaping Use   Vaping Use: Never used  Substance Use Topics   Alcohol use: Not Currently   Drug use: No     Allergies   Patient has no known allergies.   Review of Systems Review of Systems Per HPI  Physical Exam Triage Vital Signs ED Triage Vitals  Enc Vitals Group     BP 03/26/23 1754 138/89     Pulse Rate 03/26/23 1754 86     Resp 03/26/23 1754 18     Temp 03/26/23 1754 97.9 F (36.6 C)     Temp Source 03/26/23 1754 Oral     SpO2 03/26/23 1754 97 %     Weight --      Height --      Head Circumference --      Peak Flow --      Pain  Score 03/26/23 1755 0     Pain Loc --      Pain Edu? --      Excl. in GC? --    No data found.  Updated Vital Signs BP 138/89 (BP Location: Right Arm)   Pulse 86   Temp 97.9 F (36.6 C) (Oral)   Resp 18   LMP 03/17/2023 (Approximate)   SpO2 97%   Visual Acuity Right Eye Distance:   Left Eye Distance:   Bilateral Distance:    Right Eye Near:   Left Eye Near:    Bilateral Near:     Physical Exam Constitutional:      General: She is not in acute distress.    Appearance: Normal appearance. She is not toxic-appearing or diaphoretic.  HENT:     Head: Normocephalic and atraumatic.     Right Ear: Tympanic membrane and ear canal normal.     Left Ear: Tympanic membrane and ear canal normal.     Nose: Congestion present.     Mouth/Throat:     Mouth: Mucous membranes are moist.     Pharynx: No posterior oropharyngeal erythema.  Eyes:     General: Lids are normal. Lids are everted, no foreign bodies appreciated. Vision grossly intact. Gaze aligned appropriately.     Extraocular Movements: Extraocular movements intact.     Conjunctiva/sclera:     Left eye: Left  conjunctiva is injected. Exudate present. No chemosis or hemorrhage.    Pupils: Pupils are equal, round, and reactive to light.  Cardiovascular:     Rate and Rhythm: Normal rate and regular rhythm.     Pulses: Normal pulses.     Heart sounds: Normal heart sounds.  Pulmonary:     Effort: Pulmonary effort is normal. No respiratory distress.     Breath sounds: Normal breath sounds. No stridor. No wheezing, rhonchi or rales.  Abdominal:     General: Abdomen is flat. Bowel sounds are normal.     Palpations: Abdomen is soft.  Musculoskeletal:        General: Normal range of motion.     Cervical back: Normal range of motion.  Skin:    General: Skin is warm and dry.  Neurological:     General: No focal deficit present.     Mental Status: She is alert and oriented to person, place, and time. Mental status is at baseline.  Psychiatric:        Mood and Affect: Mood normal.        Behavior: Behavior normal.      UC Treatments / Results  Labs (all labs ordered are listed, but only abnormal results are displayed) Labs Reviewed - No data to display  EKG   Radiology No results found.  Procedures Procedures (including critical care time)  Medications Ordered in UC Medications - No data to display  Initial Impression / Assessment and Plan / UC Course  I have reviewed the triage vital signs and the nursing notes.  Pertinent labs & imaging results that were available during my care of the patient were reviewed by me and considered in my medical decision making (see chart for details).     1.  Allergic rhinitis  Patient prescribed daily antihistamine Zyrtec and Flonase.  Advised to avoid Benadryl while taking this medication.  Advised follow-up with allergy specialist at provided contact information if symptoms persist or worsen.  No concern for bacterial infection on exam at this time.  2.  Bacterial conjunctivitis of the  left eye  Will treat with erythromycin ointment.  Advised  to change pillowcase and linen daily.  Visual acuity appears normal.  Advised follow-up if symptoms persist or worsen.  Patient verbalized understanding and was agreeable with plan. Final Clinical Impressions(s) / UC Diagnoses   Final diagnoses:  Bacterial conjunctivitis of left eye  Seasonal allergic rhinitis, unspecified trigger     Discharge Instructions      I have prescribed you a topical medication that will go in your eye to treat infection.  Change pillowcase and linen daily to prevent reinfection.  Recommending staying out of work for 24 hours after starting medication to prevent spread of infection.  I have prescribed you allergy medicine and nasal spray to help alleviate your nasal congestion.  Follow-up with allergy specialist if symptoms persist or worsen.    ED Prescriptions     Medication Sig Dispense Auth. Provider   erythromycin ophthalmic ointment Place a 1/2 inch ribbon of ointment into the lower eyelid 4 times daily for 7 days. 3.5 g Ervin Knack E, [place]   cetirizine (ZYRTEC) 10 MG tablet Take 1 tablet (10 mg total) by mouth daily. 30 tablet [place], [place] E, [place]   fluticasone [facility]) 50 MCG/ACT nasal spray Place 1 spray into both nostrils daily. 16 g Gustavus Bryant, [place]      PDMP not reviewed this encounter.   Gustavus Bryant, [place] 03/26/23 1834    Gustavus Bryant, [place] 03/26/23 [phone]

## 2023-05-24 ENCOUNTER — Ambulatory Visit: Payer: Medicaid Other | Admitting: Internal Medicine

## 2023-06-14 ENCOUNTER — Ambulatory Visit: Payer: Medicaid Other | Admitting: Internal Medicine

## 2023-09-10 ENCOUNTER — Ambulatory Visit
Admission: EM | Admit: 2023-09-10 | Discharge: 2023-09-10 | Disposition: A | Discharge status: Home / Self Care | Payer: Medicaid Other | Attending: Internal Medicine | Admitting: Internal Medicine

## 2023-09-10 ENCOUNTER — Other Ambulatory Visit: Payer: Self-pay

## 2023-09-10 DIAGNOSIS — Z1152 Encounter for screening for COVID-19: Secondary | ICD-10-CM | POA: Diagnosis not present

## 2023-09-10 DIAGNOSIS — B349 Viral infection, unspecified: Secondary | ICD-10-CM | POA: Insufficient documentation

## 2023-09-10 NOTE — Discharge Instructions (Signed)
It appears that you have a viral illness that should run its course.  Ensure adequate fluids, rest, supportive care.  COVID test pending.  Follow-up if any symptoms persist or worsen.

## 2023-09-10 NOTE — ED Provider Notes (Signed)
[facility]    CSN: [id number] Arrival date & time: 09/10/23  1244      History   Chief Complaint Chief Complaint  Patient presents with   Fever   Fatigue    HPI Krystal Gonzalez is a 42 y.o. female.   Patient presents with 2 to 3-day history of fever, fatigue, runny nose, sore throat.  Denies any known sick contacts.  Patient is not sure temp max at home but states that she felt feverish.  She is taking Robitussin for symptoms.  Denies any history of asthma or COPD.   Fever   Past Medical History:  Diagnosis Date   Medical history non-contributory     Patient Active Problem List   Diagnosis Date Noted   Encounter for induction of labor 03/28/2022   Vaginal delivery 03/28/2022   Advanced maternal age in multigravida 03/28/2022    Past Surgical History:  Procedure Laterality Date   NO PAST SURGERIES      OB History     Gravida  5   Para  5   Term  5   Preterm      AB      Living  5      SAB      IAB      Ectopic      Multiple  0   Live Births  5            Home Medications    Prior to Admission medications   Medication Sig Start Date End Date Taking? Authorizing Provider  cetirizine (ZYRTEC) 10 MG tablet Take 1 tablet (10 mg total) by mouth daily. 03/26/23   Gustavus Bryant, FNP  erythromycin ophthalmic ointment Place a 1/2 inch ribbon of ointment into the lower eyelid 4 times daily for 7 days. 03/26/23   Gustavus Bryant, FNP  fluticasone (FLONASE) 50 MCG/ACT nasal spray Place 1 spray into both nostrils daily. 03/26/23   Gustavus Bryant, FNP  omeprazole (PRILOSEC OTC) 20 MG tablet Take 20 mg by mouth daily.    [provider]    Family History Family History  Problem Relation Age of Onset   Hypertension Mother     Social History Social History   Tobacco Use   Smoking status: Former    Types: Cigars   Smokeless tobacco: Never  Vaping Use   Vaping status: Never Used  Substance Use Topics   Alcohol use:  Not Currently   Drug use: No     Allergies   Patient has no known allergies.   Review of Systems Review of Systems Per HPI  Physical Exam Triage Vital Signs ED Triage Vitals  Encounter Vitals Group     BP 09/10/23 1323 120/75     Systolic BP Percentile --      Diastolic BP Percentile --      Pulse Rate 09/10/23 1323 65     Resp 09/10/23 1323 18     Temp 09/10/23 1323 98.5 F (36.9 C)     Temp Source 09/10/23 1323 Oral     SpO2 09/10/23 1323 98 %     Weight --      Height --      Head Circumference --      Peak Flow --      Pain Score 09/10/23 1324 0     Pain Loc --      Pain Education --      Exclude from Growth Chart --  No data found.  Updated Vital Signs BP 120/75 (BP Location: Left Arm)   Pulse 65   Temp 98.5 F (36.9 C) (Oral)   Resp 18   LMP 08/27/2023 (Approximate)   SpO2 98%   Visual Acuity Right Eye Distance:   Left Eye Distance:   Bilateral Distance:    Right Eye Near:   Left Eye Near:    Bilateral Near:     Physical Exam Constitutional:      General: She is not in acute distress.    Appearance: Normal appearance. She is not toxic-appearing or diaphoretic.  HENT:     Head: Normocephalic and atraumatic.     Right Ear: Tympanic membrane and ear canal normal.     Left Ear: Tympanic membrane and ear canal normal.     Nose: Congestion present.     Mouth/Throat:     Mouth: Mucous membranes are moist.     Pharynx: No posterior oropharyngeal erythema.  Eyes:     Extraocular Movements: Extraocular movements intact.     Conjunctiva/sclera: Conjunctivae normal.     Pupils: Pupils are equal, round, and reactive to light.  Cardiovascular:     Rate and Rhythm: Normal rate and regular rhythm.     Pulses: Normal pulses.     Heart sounds: Normal heart sounds.  Pulmonary:     Effort: Pulmonary effort is normal. No respiratory distress.     Breath sounds: Normal breath sounds. No stridor. No wheezing, rhonchi or rales.  Abdominal:     General:  Abdomen is flat. Bowel sounds are normal.     Palpations: Abdomen is soft.  Musculoskeletal:        General: Normal range of motion.     Cervical back: Normal range of motion.  Skin:    General: Skin is warm and dry.  Neurological:     General: No focal deficit present.     Mental Status: She is alert and oriented to person, place, and time. Mental status is at baseline.  Psychiatric:        Mood and Affect: Mood normal.        Behavior: Behavior normal.      UC Treatments / Results  Labs (all labs ordered are listed, but only abnormal results are displayed) Labs Reviewed  SARS CORONAVIRUS 2 (TAT 6-24 HRS)    EKG   Radiology No results found.  Procedures Procedures (including critical care time)  Medications Ordered in UC Medications - No data to display  Initial Impression / Assessment and Plan / UC Course  I have reviewed the triage vital signs and the nursing notes.  Pertinent labs & imaging results that were available during my care of the patient were reviewed by me and considered in my medical decision making (see chart for details).     Suspect viral cause of symptoms.  Strep test not indicated given physical exam.  COVID test pending.  Advised supportive care, fluids, rest, safe over-the-counter cold and flu medications.  Advised strict follow-up if any symptoms persist or worsen.  Patient verbalized understanding and was agreeable with plan. Final Clinical Impressions(s) / UC Diagnoses   Final diagnoses:  Viral illness     Discharge Instructions      It appears that you have a viral illness that should run its course.  Ensure adequate fluids, rest, supportive care.  COVID test pending.  Follow-up if any symptoms persist or worsen.    ED Prescriptions   None  PDMP not reviewed this encounter.   Gustavus Bryant, [place] 09/10/23 1341

## 2023-09-10 NOTE — ED Triage Notes (Signed)
Fever and Fatigue x 2 days.   Pt states symptoms started yesterday. Pt reports she feels a little better but wanted to be check.

## 2023-09-11 LAB — SARS CORONAVIRUS 2 (TAT 6-24 HRS): SARS Coronavirus 2: NEGATIVE
